# Patient Record
Sex: Female | Born: 1960 | Race: White | Hispanic: No | Marital: Married | Smoking: Never smoker
Health system: Southern US, Community
[De-identification: ages and names within clinical notes are randomized; demographics above are authoritative.]

## PROBLEM LIST (undated history)

## (undated) DIAGNOSIS — I10 Essential (primary) hypertension: Secondary | ICD-10-CM

## (undated) HISTORY — DX: Essential (primary) hypertension: I10

---

## 2004-11-28 ENCOUNTER — Ambulatory Visit: Payer: Self-pay

## 2005-04-28 HISTORY — PX: VAGINAL HYSTERECTOMY: SUR661

## 2005-07-13 ENCOUNTER — Emergency Department: Payer: Self-pay | Admitting: Emergency Medicine

## 2005-08-05 ENCOUNTER — Ambulatory Visit: Payer: Self-pay | Admitting: Gastroenterology

## 2005-11-20 ENCOUNTER — Ambulatory Visit: Payer: Self-pay | Admitting: Unknown Physician Specialty

## 2006-03-24 ENCOUNTER — Ambulatory Visit: Payer: Self-pay | Admitting: Unknown Physician Specialty

## 2007-05-11 ENCOUNTER — Ambulatory Visit: Payer: Self-pay | Admitting: Unknown Physician Specialty

## 2009-03-06 ENCOUNTER — Ambulatory Visit: Payer: Self-pay | Admitting: Unknown Physician Specialty

## 2009-05-24 ENCOUNTER — Ambulatory Visit: Payer: Self-pay | Admitting: Family Medicine

## 2009-06-15 ENCOUNTER — Ambulatory Visit: Payer: Self-pay | Admitting: Unknown Physician Specialty

## 2009-07-06 ENCOUNTER — Ambulatory Visit: Payer: Self-pay | Admitting: Gastroenterology

## 2009-08-08 ENCOUNTER — Ambulatory Visit: Payer: Self-pay | Admitting: Unknown Physician Specialty

## 2010-03-13 ENCOUNTER — Ambulatory Visit: Payer: Self-pay | Admitting: Family Medicine

## 2010-04-28 HISTORY — PX: CHOLECYSTECTOMY: SHX55

## 2010-04-28 HISTORY — PX: RIGHT OOPHORECTOMY: SHX2359

## 2010-08-25 IMAGING — NM NUCLEAR MEDICINE HEPATOHBILIARY INCLUDE GB
2 series · 12 of 12 positions shown · non-contrast
Comparison: none

REASON FOR EXAM: abdominal pain epigastric and RUQ and nausea If Neg US
do Hida with CCK
COMMENTS:

[Series 1000: gallbladder dynamic (results) · 4.80mm/px · 6 of 60 frames shown]
[frame 6/60]
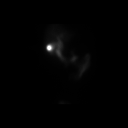
[frame 16/60]
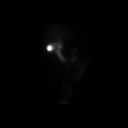
[frame 26/60]
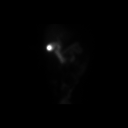
[frame 36/60]
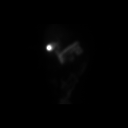
[frame 46/60]
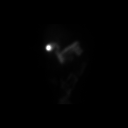
[frame 56/60]
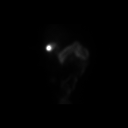

[Series 1000: gallbladder dynamic · 4.80mm/px · 6 of 60 frames shown]
[frame 6/60]
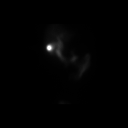
[frame 16/60]
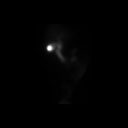
[frame 26/60]
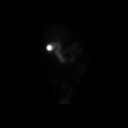
[frame 36/60]
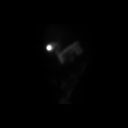
[frame 46/60]
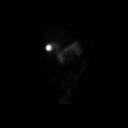
[frame 56/60]
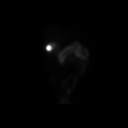

[12 of 12 positions shown; findings below may reference images not displayed]

PROCEDURE:     NM  - NM HEPATO WITH GB EJECT FRACTION  - July 06, 2009 [DATE]

RESULT:     Following intravenous administration of 7.98 mCi technetium 99m
Choletec, there is noted prompt visualization of tracer activity in the
liver at 3 minutes. At 30 minutes tracer activity is visualized in the
gallbladder, common duct and proximal small bowel.

The gallbladder ejection fraction at 30 minutes measures 6% which is below
the normal range.
IMPRESSION: 1. Normal hepatobiliary scan.
2. The gallbladder ejection fraction measures 6% which is below the normal
range.

## 2011-01-01 ENCOUNTER — Encounter: Payer: Self-pay | Admitting: Internal Medicine

## 2011-01-01 ENCOUNTER — Ambulatory Visit (INDEPENDENT_AMBULATORY_CARE_PROVIDER_SITE_OTHER): Payer: PRIVATE HEALTH INSURANCE | Admitting: Internal Medicine

## 2011-01-01 DIAGNOSIS — I10 Essential (primary) hypertension: Secondary | ICD-10-CM | POA: Insufficient documentation

## 2011-01-01 DIAGNOSIS — R002 Palpitations: Secondary | ICD-10-CM | POA: Insufficient documentation

## 2011-01-01 MED ORDER — CARVEDILOL 3.125 MG PO TABS: 3.1250 mg | ORAL_TABLET | Freq: Two times a day (BID) | ORAL | Status: DC

## 2011-01-01 NOTE — Progress Notes (Signed)
Subjective:    Patient ID: Elizabeth Dunn, female    DOB: Dec 29, 1960, 50 y.o.   MRN: [id number]  HPI Elizabeth Dunn is a 50 year old female who presents to establish care. She reports recent problems with palpitations and high blood pressure. She was seen by her previous primary care physician and noted to have heart rate of 120 and blood pressure 190s/90s. She did not have EKG at this time. He ultimately placed her on metoprolol and hydrochlorothiazide. She took the metoprolol but developed some fatigue and sweats, so opted to stop this medication. She took the hydrochlorothiazide for one week total and then stopped. She continues to have episodes where she feels her heart racing. These episodes occur several times per day and are not associated with exertion. During these episodes she does not have chest pain, diaphoresis, nausea or other symptoms. She denies any other complaints at this time. She does note recent weight loss of nearly 20 pounds, but attributes this to her new job where she walks approximately 12 miles daily. She reports good appetite and very healthy diet low in saturated fat and high in fiber. She denies any fatigue, chills, or other complaints.  Outpatient Encounter Prescriptions as of 01/01/2011  Medication Sig Dispense Refill  . ALPRAZolam (XANAX) 0.5 MG tablet Take 0.5 mg by mouth 2 (two) times daily.        Marland Kitchen FLUoxetine (PROZAC) 20 MG capsule Take 20 mg by mouth daily.          Review of Systems  Constitutional: Negative for fever, chills, appetite change, fatigue and unexpected weight change.  HENT: Positive for rhinorrhea and postnasal drip. Negative for ear pain, congestion, sore throat, trouble swallowing, neck pain, voice change and sinus pressure.   Eyes: Negative for visual disturbance.  Respiratory: Positive for chest tightness. Negative for cough, shortness of breath, wheezing and stridor.   Cardiovascular: Positive for palpitations. Negative for chest pain and leg  swelling.  Gastrointestinal: Negative for nausea, vomiting, abdominal pain, diarrhea, constipation, blood in stool, abdominal distention and anal bleeding.  Genitourinary: Negative for dysuria and flank pain.  Musculoskeletal: Negative for myalgias, arthralgias and gait problem.  Skin: Negative for color change, pallor and rash.  Neurological: Negative for dizziness and headaches.  Hematological: Negative for adenopathy. Does not bruise/bleed easily.  Psychiatric/Behavioral: Negative for suicidal ideas, sleep disturbance and dysphoric mood. The patient is not nervous/anxious.     BP 168/93  Pulse 103  Temp 98.2 F (36.8 C)  Resp 16  Ht 5\' 7"  (1.702 m)  Wt 172 lb 8 oz (78.245 kg)  BMI 27.02 kg/m2  SpO2 98%     Objective:   Physical Exam  Constitutional: She is oriented to person, place, and time. She appears well-developed and well-nourished. No distress.  HENT:  Head: Normocephalic and atraumatic.  Right Ear: Hearing, tympanic membrane, external ear and ear canal normal.  Left Ear: Hearing, tympanic membrane, external ear and ear canal normal.  Nose: Nose normal.  Mouth/Throat: Oropharynx is clear and moist and mucous membranes are normal. No oropharyngeal exudate.  Eyes: Conjunctivae are normal. Pupils are equal, round, and reactive to light. Right eye exhibits no discharge. Left eye exhibits no discharge. No scleral icterus.  Neck: Trachea normal and normal range of motion. Neck supple. Normal carotid pulses and no JVD present. Carotid bruit is not present. No tracheal deviation present. No thyromegaly present.  Cardiovascular: Regular rhythm, S1 normal, S2 normal, normal heart sounds, intact distal pulses and normal pulses.  Tachycardia present.  Exam reveals no gallop and no friction rub.   No murmur heard.      BP repeated in both arms 162/90 right arm, 160/88 left arm  Pulmonary/Chest: Effort normal and breath sounds normal. No respiratory distress. She has no wheezes. She  has no rales. She exhibits no tenderness.  Musculoskeletal: Normal range of motion. She exhibits no edema and no tenderness.       Arms:      Left arm slightly larger than right  Lymphadenopathy:    She has no cervical adenopathy.  Neurological: She is alert and oriented to person, place, and time. No cranial nerve deficit. She exhibits normal muscle tone. Coordination normal.  Skin: Skin is warm and dry. No rash noted. She is not diaphoretic. No erythema. No pallor.  Psychiatric: She has a normal mood and affect. Her behavior is normal. Judgment and thought content normal.    EKG: NSR      Assessment & Plan:  1. Palpitations - patient with a recent history of palpitations and tachycardia. Noted to be tachycardic on initial exam today. Otherwise cardiac exam is normal. EKG showed normal sinus rhythm with a rate of 90. Question if she may have thyroid dysfunction contributing to her tachycardia. Will check a CBC, TSH and CMP with labs today. She will return to clinic in one week. If the labs unremarkable, would prefer to set her up with a Holter monitor for further evaluation.  2. Hypertension - patient noted to have elevated blood pressure both today and by her previous physician. She is not currently taking any medications for this. Will start carvedilol 3.25 mg by mouth twice daily. She was not able to tolerate metoprolol, but hoping she will tolerate a more cardioselective beta blocker. She will return to clinic in one week.

## 2011-01-01 NOTE — Patient Instructions (Signed)
Labs today. We will call with results. Return visit in 1 week.

## 2011-01-02 LAB — CBC WITH DIFFERENTIAL/PLATELET
Basophils Absolute: 0 10*3/uL (ref 0.0–0.1)
Eosinophils Absolute: 0.1 10*3/uL (ref 0.0–0.7)
HCT: 40.9 % (ref 36.0–46.0)
Hemoglobin: 13.7 g/dL (ref 12.0–15.0)
Lymphs Abs: 1.4 10*3/uL (ref 0.7–4.0)
MCHC: 33.6 g/dL (ref 30.0–36.0)
MCV: 95.9 fl (ref 78.0–100.0)
Monocytes Absolute: 0.3 10*3/uL (ref 0.1–1.0)
Monocytes Relative: 5.5 % (ref 3.0–12.0)
Neutro Abs: 4.1 10*3/uL (ref 1.4–7.7)
Platelets: 329 10*3/uL (ref 150.0–400.0)
RDW: 12.3 % (ref 11.5–14.6)

## 2011-01-02 LAB — COMPREHENSIVE METABOLIC PANEL
AST: 25 U/L (ref 0–37)
BUN: 16 mg/dL (ref 6–23)
Calcium: 8.9 mg/dL (ref 8.4–10.5)
Chloride: 102 mEq/L (ref 96–112)
Creatinine, Ser: 0.5 mg/dL (ref 0.4–1.2)
Total Bilirubin: 0.8 mg/dL (ref 0.3–1.2)

## 2011-01-03 ENCOUNTER — Other Ambulatory Visit: Payer: Self-pay | Admitting: Internal Medicine

## 2011-01-03 DIAGNOSIS — R000 Tachycardia, unspecified: Secondary | ICD-10-CM

## 2011-01-06 ENCOUNTER — Encounter: Payer: Self-pay | Admitting: Internal Medicine

## 2011-01-08 ENCOUNTER — Encounter: Payer: Self-pay | Admitting: Internal Medicine

## 2011-01-08 ENCOUNTER — Encounter (INDEPENDENT_AMBULATORY_CARE_PROVIDER_SITE_OTHER): Payer: PRIVATE HEALTH INSURANCE | Admitting: *Deleted

## 2011-01-08 ENCOUNTER — Telehealth: Payer: Self-pay | Admitting: Internal Medicine

## 2011-01-08 ENCOUNTER — Ambulatory Visit (INDEPENDENT_AMBULATORY_CARE_PROVIDER_SITE_OTHER): Payer: PRIVATE HEALTH INSURANCE | Admitting: Internal Medicine

## 2011-01-08 VITALS — BP 160/89 | HR 83 | Temp 98.3°F | Resp 16 | Ht 67.0 in | Wt 173.5 lb

## 2011-01-08 DIAGNOSIS — I471 Supraventricular tachycardia: Secondary | ICD-10-CM

## 2011-01-08 DIAGNOSIS — F411 Generalized anxiety disorder: Secondary | ICD-10-CM

## 2011-01-08 DIAGNOSIS — R002 Palpitations: Secondary | ICD-10-CM

## 2011-01-08 DIAGNOSIS — F419 Anxiety disorder, unspecified: Secondary | ICD-10-CM | POA: Insufficient documentation

## 2011-01-08 DIAGNOSIS — I10 Essential (primary) hypertension: Secondary | ICD-10-CM

## 2011-01-08 MED ORDER — FLUOXETINE HCL 20 MG PO CAPS: 40.0000 mg | ORAL_CAPSULE | Freq: Every day | ORAL | Status: DC

## 2011-01-08 MED ORDER — CARVEDILOL 3.125 MG PO TABS: 6.2500 mg | ORAL_TABLET | Freq: Two times a day (BID) | ORAL | Status: DC

## 2011-01-08 NOTE — Telephone Encounter (Signed)
Patient was informed of her results today at her visit.

## 2011-01-08 NOTE — Patient Instructions (Signed)
Holter monitor today. Follow up with Dr. Mariah Milling next week at [facility].

## 2011-01-08 NOTE — Progress Notes (Signed)
Subjective:    Patient ID: Elizabeth Dunn, female    DOB: May 01, 1960, 50 y.o.   MRN: [id number]  HPI Elizabeth Dunn is a 50 year old female with a recent history of hypertension and palpitations. She reports the palpitations occur frequently during the day and are described as a rapid regular heart beats accompanied by tightness in her mid chest. She denies any shortness of breath, diaphoresis, nausea or other symptoms. At her last visit we checked lab work including CBC, electrolytes, and thyroid function all of which were normal. We also checked EKG which was normal. We started her on carvedilol, however, she reports no improvement on this medication.  Outpatient Prescriptions Prior to Visit  Medication Sig Dispense Refill  . ALPRAZolam (XANAX) 0.5 MG tablet Take 0.5 mg by mouth 2 (two) times daily.        . carvedilol (COREG) 3.125 MG tablet Take 1 tablet (3.125 mg total) by mouth 2 (two) times daily.  60 tablet  11  . FLUoxetine (PROZAC) 20 MG capsule Take 20 mg by mouth daily.          Review of Systems  Constitutional: Negative for fever, chills and fatigue.  Respiratory: Positive for chest tightness. Negative for cough, shortness of breath and wheezing.   Cardiovascular: Positive for chest pain and palpitations. Negative for leg swelling.  Neurological: Negative for dizziness, weakness and light-headedness.  Psychiatric/Behavioral: Negative for dysphoric mood. The patient is not nervous/anxious.    BP 160/89  Pulse 83  Temp(Src) 98.3 F (36.8 C) (Oral)  Resp 16  Ht 5\' 7"  (1.702 m)  Wt 173 lb 8 oz (78.699 kg)  BMI 27.17 kg/m2  SpO2 100%     Objective:   Physical Exam  Constitutional: She is oriented to person, place, and time. She appears well-developed and well-nourished. No distress.  HENT:  Head: Normocephalic and atraumatic.  Right Ear: External ear normal.  Left Ear: External ear normal.  Nose: Nose normal.  Mouth/Throat: Oropharynx is clear and moist. No oropharyngeal  exudate.  Eyes: Conjunctivae and EOM are normal. Pupils are equal, round, and reactive to light. Right eye exhibits no discharge. Left eye exhibits no discharge. No scleral icterus.  Neck: Normal range of motion. Neck supple. Carotid bruit is not present. No tracheal deviation present. No thyromegaly present.  Cardiovascular: Normal rate, regular rhythm, normal heart sounds and intact distal pulses.  Exam reveals no gallop and no friction rub.   No murmur heard. Pulmonary/Chest: Effort normal and breath sounds normal. No respiratory distress. She has no wheezes. She has no rales. She exhibits no tenderness.  Musculoskeletal: Normal range of motion. She exhibits no edema and no tenderness.  Lymphadenopathy:    She has no cervical adenopathy.  Neurological: She is alert and oriented to person, place, and time. No cranial nerve deficit. She exhibits normal muscle tone. Coordination normal.  Skin: Skin is warm and dry. No rash noted. She is not diaphoretic. No erythema. No pallor.  Psychiatric: She has a normal mood and affect. Her behavior is normal. Judgment and thought content normal.          Assessment & Plan:  1. Palpitations - patient with recent history of palpitations. Evaluation here including lab work (CBC, CMP, TSH) and EKG were unremarkable. Exam today is normal. Question if she may be having runs of SVT. I discussed her case with Dr. Mariah Milling from cardiology today. We will set her up with a Holter monitor and she will have an evaluation with Dr. Mariah Milling  next week. Today, we will increase her dose of carvedilol to 6.25 mg twice daily. She will return to clinic here in one month, or earlier should her symptoms become more severe.  2. Hypertension - as above, will increase dose of carvedilol today. Recent lab evaluation including renal function was normal. She will return to clinic in one month.  3. Anxiety - patient has a history of anxiety in the past. She is currently taking Prozac. She  reports no recent increase in her anxiety or stress level. However, she recognizes that anxiety may contribute to palpitations. We will try increasing her Prozac to 40 mg daily to see if this results in any improvement in her palpitations.

## 2011-01-15 ENCOUNTER — Encounter: Payer: Self-pay | Admitting: Internal Medicine

## 2011-01-15 ENCOUNTER — Ambulatory Visit (INDEPENDENT_AMBULATORY_CARE_PROVIDER_SITE_OTHER): Payer: PRIVATE HEALTH INSURANCE | Admitting: Internal Medicine

## 2011-01-15 VITALS — BP 149/88 | HR 79 | Temp 98.9°F | Resp 16 | Ht 67.0 in | Wt 175.5 lb

## 2011-01-15 DIAGNOSIS — N760 Acute vaginitis: Secondary | ICD-10-CM

## 2011-01-15 MED ORDER — ESTRADIOL 0.1 MG/GM VA CREA: 1.0000 g | TOPICAL_CREAM | Freq: Every day | VAGINAL | Status: DC

## 2011-01-15 MED ORDER — NYSTATIN-TRIAMCINOLONE 100000-0.1 UNIT/GM-% EX OINT: TOPICAL_OINTMENT | Freq: Two times a day (BID) | CUTANEOUS | Status: DC

## 2011-01-15 NOTE — Patient Instructions (Signed)
Apply the Estrace cream every night for 2 weeks, then 1-2 times per week. Apply the triamcinolone cream daily x 3 days. Return to clinic in 1 month.

## 2011-01-15 NOTE — Progress Notes (Signed)
  Subjective:    Patient ID: Elizabeth Dunn, female    DOB: [birth year], 50 y.o.   MRN: [id number]  HPI Elizabeth Dunn is a 50 year old female who presents for an acute visit complaining of vaginal pain. She reports recurrent episodes of vaginal pain, which typically occur on a monthly basis. These are located on the left side of her vagina. She has been evaluated in the past by gynecology and was treated with a topical cream for unknown condition. She denies any history of herpes. She does note that during her first marriage her husband was unfaithful to her and she may have been exposed to sexual transmitted diseases. She denies any vaginal discharge. She denies any abdominal pain, fever, chills, dysuria, or other symptoms. The skin on the left side of her genital area is typically tender to touch for several days and then the tenderness resolves. She has tried topical over-the-counter cream such as Desitin or topical powders with no improvement. She's not currently sexually active on a regular basis with her husband. She reports her last intercourse was approximately 6 months ago.  Outpatient Encounter Prescriptions as of 01/15/2011  Medication Sig Dispense Refill  . ALPRAZolam (XANAX) 0.5 MG tablet Take 0.5 mg by mouth 2 (two) times daily.        . carvedilol (COREG) 3.125 MG tablet Take 2 tablets (6.25 mg total) by mouth 2 (two) times daily.  60 tablet  11  . FLUoxetine (PROZAC) 20 MG capsule Take 2 capsules (40 mg total) by mouth daily.  30 capsule  11    Review of Systems  Constitutional: Negative for fever and chills.  Gastrointestinal: Negative for abdominal pain.  Genitourinary: Positive for vaginal pain. Negative for dysuria, flank pain, vaginal bleeding, vaginal discharge and pelvic pain.   BP 149/88  Pulse 79  Temp(Src) 98.9 F (37.2 C) (Oral)  Resp 16  Ht 5\' 7"  (1.702 m)  Wt 175 lb 8 oz (79.606 kg)  BMI 27.49 kg/m2  SpO2 100%     Objective:   Physical Exam  Constitutional: She  appears well-developed and well-nourished.  HENT:  Head: Normocephalic and atraumatic.  Genitourinary:    There is tenderness and lesion on the left labia. No vaginal discharge found.       Erythema noted to left of labia with small abrasion          Assessment & Plan:  1. Vaginitis -patient with left-sided genital pain. Exam is remarkable for an erythematous area on the lower left side of the labia majora. There is a small abrasion at this site. There are no vesicles or other evidence of herpes infection. However, given that this pain occurs on an intermittent basis in the same location it is suspicious for herpes infection. We will send serology for HSV 1 and 2. If positive, will consider treating with acyclovir. Given her age there may be an element of atrophic vaginitis, leading to the abrasion. We'll try applying topical Estrace cream daily for 2 weeks then one to 2 times per week. She will also apply topical nystatin and triamcinolone cream for the next 2 or 3 days to see if any improvement. She will call to report what other creams she has been using. She will followup in one month.

## 2011-01-16 ENCOUNTER — Encounter: Payer: Self-pay | Admitting: Cardiovascular Disease

## 2011-01-16 ENCOUNTER — Telehealth: Payer: Self-pay | Admitting: Internal Medicine

## 2011-01-16 ENCOUNTER — Ambulatory Visit (INDEPENDENT_AMBULATORY_CARE_PROVIDER_SITE_OTHER): Payer: PRIVATE HEALTH INSURANCE | Admitting: Cardiovascular Disease

## 2011-01-16 VITALS — BP 159/84 | HR 74 | Resp 18 | Ht 67.0 in | Wt 173.0 lb

## 2011-01-16 DIAGNOSIS — I10 Essential (primary) hypertension: Secondary | ICD-10-CM

## 2011-01-16 DIAGNOSIS — F419 Anxiety disorder, unspecified: Secondary | ICD-10-CM

## 2011-01-16 DIAGNOSIS — R002 Palpitations: Secondary | ICD-10-CM

## 2011-01-16 LAB — HSV 2 ANTIBODY, IGG: HSV 2 Glycoprotein G Ab, IgG: 0.12 IV

## 2011-01-16 MED ORDER — LISINOPRIL 20 MG PO TABS: 20.0000 mg | ORAL_TABLET | Freq: Every day | ORAL | Status: DC

## 2011-01-16 MED ORDER — CARVEDILOL 25 MG PO TABS: 25.0000 mg | ORAL_TABLET | Freq: Two times a day (BID) | ORAL | Status: DC

## 2011-01-16 NOTE — Patient Instructions (Addendum)
You are doing well. Please increase the coreg to 12.5 mg twice a day (cut the coreg 25 mg in 1/2) If you continue to have fast heart rate after 5 days, you could try a full pill (25 mg) Please start lisinopril 20 mg daily for high blod pressure Please call us if you have new issues that need to be addressed before your next appt.  We will call you for a follow up Appt. In 1 months

## 2011-01-16 NOTE — Telephone Encounter (Signed)
There is not a cheaper alternative that I know of. Premarin is equally as expensive.  We can try to get her more samples.

## 2011-01-16 NOTE — Telephone Encounter (Signed)
Patient called and stated that the Estrace Cream is too expensive at $139 a tube.  She inquired about getting a substitute that would work but cheaper. Please advise.

## 2011-01-16 NOTE — Assessment & Plan Note (Signed)
Her underlying anxiety may be exacerbating her tachycardia appeared she recently had an increase to her SSRI.

## 2011-01-16 NOTE — Assessment & Plan Note (Signed)
We will start her on lisinopril 10 mg, titrated to 20 mg if needed for hypertension. Most of her record her blood pressures at home when she is relaxed are elevated with systolic pressure greater than 150. An alternate medication that could be tried would be diltiazem cd 120 mg. This could potentially even help her heart rate and any arrhythmia.

## 2011-01-16 NOTE — Progress Notes (Signed)
Patient ID: Elizabeth Dunn, female    DOB: 03/13/1961, 50 y.o.   MRN: [id number]  HPI Comments: Elizabeth Dunn is a very pleasant 50 year old woman, patient of Dr. Dan Humphreys, who presents by referral for symptoms of palpitations.  She recently completed a 48-hour Holter monitor that showed normal sinus rhythm with periods of sinus tachycardia, rare APC, very rare short run of SVT lasting 6 beats. She was taking coronary 6.25 mg b.i.d. While wearing the Holter. Her heart rate was elevated, typically in the 100-120 beats per minute range while at work, improving into the 90s while at rest at home, into the 85-90 range while sleeping. No other significant arrhythmias were noted.  She reports that her blood pressure has been running high for quite some time now with systolic pressures typically in the 150-170 range. She works long hours, denies any significant stress. She reports a family history of hypertension. She appreciates her fast heart rhythm more than normal over the past several weeks.   She did have an episode 4 weeks here where she felt very shaky, blood pressure was very high at 190/90, heart rate of 120. She was started on metoprolol succinate though she reports this made her feel ill with sweating. She has since changed to Coreg 3.125 mg b.i.d. With increase to 6.25mg  B.i.d. While she wore the Holter.  EKG today shows normal sinus rhythm with rate 80 beats per minute with no significant ST or T wave changes   Outpatient Encounter Prescriptions as of 50/20/2012  Medication Sig Dispense Refill  . ALPRAZolam (XANAX) 0.5 MG tablet Take 0.5 mg by mouth 2 (two) times daily.        Marland Kitchen estradiol (ESTRACE) 0.1 MG/GM vaginal cream Place 1 g vaginally daily.  42.5 g  1  . FLUoxetine (PROZAC) 20 MG capsule Take 2 capsules (40 mg total) by mouth daily.  30 capsule  11  . carvedilol (COREG) 3.125 MG tablet Take 2 tablets (6.25 mg total) by mouth 2 (two) times daily.  60 tablet  11  . DISCONTD:  nystatin-triamcinolone (MYCOLOG) ointment Apply topically 2 (two) times daily.  30 g  0     Review of Systems  Constitutional: Negative.   HENT: Negative.   Eyes: Negative.   Respiratory: Negative.   Cardiovascular: Positive for palpitations.  Gastrointestinal: Negative.   Musculoskeletal: Negative.   Skin: Negative.   Neurological: Negative.   Hematological: Negative.   Psychiatric/Behavioral: Negative.   All other systems reviewed and are negative.    BP 159/84  Pulse 74  Resp 18  Ht 5\' 7"  (1.702 m)  Wt 173 lb (78.472 kg)  BMI 27.10 kg/m2  Physical Exam  Nursing note and vitals reviewed. Constitutional: She is oriented to person, place, and time. She appears well-developed and well-nourished.  HENT:  Head: Normocephalic.  Nose: Nose normal.  Mouth/Throat: Oropharynx is clear and moist.  Eyes: Conjunctivae are normal. Pupils are equal, round, and reactive to light.  Neck: Normal range of motion. Neck supple. No JVD present.  Cardiovascular: Normal rate, regular rhythm, S1 normal, S2 normal, normal heart sounds and intact distal pulses.  Exam reveals no gallop and no friction rub.   No murmur heard. Pulmonary/Chest: Effort normal and breath sounds normal. No respiratory distress. She has no wheezes. She has no rales. She exhibits no tenderness.  Abdominal: Soft. Bowel sounds are normal. She exhibits no distension. There is no tenderness.  Musculoskeletal: Normal range of motion. She exhibits no edema and no tenderness.  Lymphadenopathy:    She has no cervical adenopathy.  Neurological: She is alert and oriented to person, place, and time. Coordination normal.  Skin: Skin is warm and dry. No rash noted. No erythema.  Psychiatric: She has a normal mood and affect. Her behavior is normal. Judgment and thought content normal.         Assessment and Plan

## 2011-01-16 NOTE — Assessment & Plan Note (Addendum)
She does have an elevated heart rate (sinus tach) seen while at work with elevated baseline heart rate. She did not tolerate metoprolol succinate though appears to be tolerating coreg. We have suggested she increase her coreg slowly as tolerated to 12.6 mg BID and possibly to 25 mg BID if needed for continued tachycardia.  If she is unable to tolerate the higher dose coreg, we would suggest bystolic.  She did have a very short run of SVT. I suspect her palpitations is from sinus tachycardia and she was asymptomatic from the SVT given it was 6 beats.

## 2011-01-20 ENCOUNTER — Other Ambulatory Visit: Payer: Self-pay | Admitting: *Deleted

## 2011-01-20 MED ORDER — ALPRAZOLAM 0.5 MG PO TABS: 0.5000 mg | ORAL_TABLET | Freq: Two times a day (BID) | ORAL | Status: DC

## 2011-01-20 NOTE — Telephone Encounter (Signed)
Patient notified. She says that she spoke with the pharmacy and they told her that if the two ingredients that are in estrace are prescribed separately instead of combined it would be cheaper for her. She is going to have the pharmacy fax over the ingredients.

## 2011-01-20 NOTE — Telephone Encounter (Signed)
Lets just have them fill them separately.

## 2011-01-20 NOTE — Telephone Encounter (Signed)
Pharmacy notified.

## 2011-01-20 NOTE — Telephone Encounter (Signed)
Pharmacy called, they said that patient was mistaken about the estrace cream, pharmacy says that there really isn't anything cheaper and you can't separate ingredients. The pharmacy was actually talking about he nystatin-triamcinolone. They are asking if they could fill only for nystatin or only triamcinolone because the two together is really expensive.

## 2011-01-20 NOTE — Telephone Encounter (Signed)
Opened in error

## 2011-01-21 ENCOUNTER — Other Ambulatory Visit: Payer: Self-pay | Admitting: Internal Medicine

## 2011-01-21 MED ORDER — ALPRAZOLAM 0.5 MG PO TABS: 0.5000 mg | ORAL_TABLET | Freq: Two times a day (BID) | ORAL | Status: DC

## 2011-01-23 ENCOUNTER — Telehealth: Payer: Self-pay | Admitting: *Deleted

## 2011-01-23 NOTE — Telephone Encounter (Signed)
Attempted to contact pt to give holter monitor results, LMOM TCB. Per Dr. Mariah Milling- NSR with rare SVT (6 beats), rare PVCs. Elevated HR during the day. Pt has f/u with Dr. Dan Humphreys 02/12/11. Will discuss with pt upon return call.

## 2011-01-27 NOTE — Telephone Encounter (Signed)
Results reviewed at [facility] 9/20, pt has no c/o or questions at this time. Will f/u as scheduled.

## 2011-02-12 ENCOUNTER — Ambulatory Visit: Payer: PRIVATE HEALTH INSURANCE | Admitting: Internal Medicine

## 2011-02-13 ENCOUNTER — Encounter: Payer: Self-pay | Admitting: Internal Medicine

## 2011-02-13 ENCOUNTER — Ambulatory Visit (INDEPENDENT_AMBULATORY_CARE_PROVIDER_SITE_OTHER): Payer: PRIVATE HEALTH INSURANCE | Admitting: Internal Medicine

## 2011-02-13 VITALS — BP 130/83 | HR 79 | Temp 98.3°F | Resp 12 | Ht 67.0 in | Wt 176.0 lb

## 2011-02-13 DIAGNOSIS — N760 Acute vaginitis: Secondary | ICD-10-CM

## 2011-02-13 DIAGNOSIS — F411 Generalized anxiety disorder: Secondary | ICD-10-CM

## 2011-02-13 DIAGNOSIS — F419 Anxiety disorder, unspecified: Secondary | ICD-10-CM

## 2011-02-13 DIAGNOSIS — I10 Essential (primary) hypertension: Secondary | ICD-10-CM

## 2011-02-13 MED ORDER — NYSTATIN 100000 UNIT/GM EX POWD: 1.0000 g | Freq: Two times a day (BID) | CUTANEOUS | Status: DC

## 2011-02-13 MED ORDER — CLONAZEPAM 1 MG PO TABS: 1.0000 mg | ORAL_TABLET | Freq: Three times a day (TID) | ORAL | Status: DC

## 2011-02-13 MED ORDER — TRIAMCINOLONE ACETONIDE 0.025 % EX OINT: TOPICAL_OINTMENT | Freq: Two times a day (BID) | CUTANEOUS | Status: AC

## 2011-02-13 NOTE — Progress Notes (Signed)
Subjective:    Patient ID: Elizabeth Dunn, female    DOB: 11-09-1960, 50 y.o.   MRN: [id number]  HPI Elizabeth Dunn is a 50 year old female with a history of anxiety, hypertension, and a recent episode of vaginitis who presents for followup. She reports that since using the nystatin cream she has had minimal improvement in her symptoms of external vaginal pain. She denies any vaginal discharge or itching. She denies any fever or chills. She has been applying both the nystatin cream and using Estrace.  She notes a recent increase in her anxiety. She reports that she is easily overwhelmed by every day activities such as preparing dinner for her family. In the past, she has taken Xanax with improvement in symptoms, however she feels that this is no longer working well for her. She notes that in the past she has suffered from depression and this seems well controlled on Prozac. However, her anxiety and sense of agitation have gotten progressively worse recently. She denies any recent family or work stressors.  In regards to her hypertension and palpitations she reports no further symptoms since she has been taking carvedilol 25 mg daily.  Outpatient Encounter Prescriptions as of 02/13/2011  Medication Sig Dispense Refill  . carvedilol (COREG) 25 MG tablet Take 1 tablet (25 mg total) by mouth 2 (two) times daily.  60 tablet  11  . estradiol (ESTRACE) 0.1 MG/GM vaginal cream Place 1 g vaginally daily.  42.5 g  1  . FLUoxetine (PROZAC) 20 MG capsule Take 2 capsules (40 mg total) by mouth daily.  30 capsule  11  . DISCONTD: ALPRAZolam (XANAX) 0.5 MG tablet Take 1 tablet (0.5 mg total) by mouth 2 (two) times daily.  60 tablet  0  . lisinopril (PRINIVIL,ZESTRIL) 20 MG tablet Take 1 tablet (20 mg total) by mouth daily.  30 tablet  11    Review of Systems  Constitutional: Negative for fever, chills, appetite change, fatigue and unexpected weight change.  HENT: Negative for ear pain, congestion, sore throat,  trouble swallowing, neck pain, voice change and sinus pressure.   Eyes: Negative for visual disturbance.  Respiratory: Negative for cough, shortness of breath, wheezing and stridor.   Cardiovascular: Negative for chest pain, palpitations and leg swelling.  Gastrointestinal: Negative for nausea, vomiting, abdominal pain, diarrhea, constipation, blood in stool, abdominal distention and anal bleeding.  Genitourinary: Positive for vaginal pain. Negative for dysuria and flank pain.  Musculoskeletal: Negative for myalgias, arthralgias and gait problem.  Skin: Negative for color change and rash.  Neurological: Negative for dizziness and headaches.  Hematological: Negative for adenopathy. Does not bruise/bleed easily.  Psychiatric/Behavioral: Negative for suicidal ideas, sleep disturbance and dysphoric mood. The patient is nervous/anxious.    BP 130/83  Pulse 79  Temp 98.3 F (36.8 C)  Resp 12  Ht 5\' 7"  (1.702 m)  Wt 176 lb (79.833 kg)  BMI 27.57 kg/m2  SpO2 100%     Objective:   Physical Exam  Constitutional: She is oriented to person, place, and time. She appears well-developed and well-nourished. No distress.  HENT:  Head: Normocephalic and atraumatic.  Right Ear: External ear normal.  Left Ear: External ear normal.  Nose: Nose normal.  Mouth/Throat: Oropharynx is clear and moist. No oropharyngeal exudate.  Eyes: Conjunctivae are normal. Pupils are equal, round, and reactive to light. Right eye exhibits no discharge. Left eye exhibits no discharge. No scleral icterus.  Neck: Normal range of motion. Neck supple. No tracheal deviation present. No thyromegaly present.  Cardiovascular: Normal rate, regular rhythm, normal heart sounds and intact distal pulses.  Exam reveals no gallop and no friction rub.   No murmur heard. Pulmonary/Chest: Effort normal and breath sounds normal. No respiratory distress. She has no wheezes. She has no rales. She exhibits no tenderness.  Genitourinary:      Musculoskeletal: Normal range of motion. She exhibits no edema and no tenderness.  Lymphadenopathy:    She has no cervical adenopathy.  Neurological: She is alert and oriented to person, place, and time. No cranial nerve deficit. She exhibits normal muscle tone. Coordination normal.  Skin: Skin is warm and dry. No rash noted. She is not diaphoretic. No erythema. No pallor.  Psychiatric: Her behavior is normal. Judgment and thought content normal. Her mood appears anxious.          Assessment & Plan:  1. Anxiety - symptoms recently worsen. Suspect that she would benefit from a longer acting anxiolytic medication. Will stop Xanax and start Clonopin. We'll start at 0.5 mg up to 3 times per day. She will advance to up to 1 mg 3 times daily as needed. She will followup in one month.  2. Vaginitis -patient with longstanding vaginitis. Exam is improved today with no further lacerations. However, I think she would benefit from nystatin powder as this would be more drying. Prescription written for nystatin powder today. We'll also use triamcinolone cream as needed for areas of external pruritus. She will followup in one month.  3. Hypertension - patient with hypertension and palpitations which are now well-controlled on carvedilol and lisinopril. We'll continue current medications. She will followup in one month.

## 2011-03-18 ENCOUNTER — Ambulatory Visit: Payer: PRIVATE HEALTH INSURANCE | Admitting: Cardiovascular Disease

## 2011-03-27 ENCOUNTER — Ambulatory Visit (INDEPENDENT_AMBULATORY_CARE_PROVIDER_SITE_OTHER): Payer: PRIVATE HEALTH INSURANCE | Admitting: Internal Medicine

## 2011-03-27 ENCOUNTER — Encounter: Payer: Self-pay | Admitting: Internal Medicine

## 2011-03-27 VITALS — BP 142/86 | HR 86 | Temp 97.5°F | Wt 179.0 lb

## 2011-03-27 DIAGNOSIS — J329 Chronic sinusitis, unspecified: Secondary | ICD-10-CM

## 2011-03-27 MED ORDER — AMOXICILLIN-POT CLAVULANATE 875-125 MG PO TABS: 1.0000 | ORAL_TABLET | Freq: Two times a day (BID) | ORAL | Status: AC

## 2011-03-27 NOTE — Patient Instructions (Signed)

## 2011-03-27 NOTE — Progress Notes (Signed)
Subjective:    Patient ID: Elizabeth Dunn, female    DOB: 08-05-1960, 50 y.o.   MRN: [id number]  Sinusitis This is a new problem. The current episode started 1 to 4 weeks ago. The problem has been gradually worsening since onset. There has been no fever. The pain is moderate. Associated symptoms include chills, congestion, coughing, ear pain, sinus pressure and a sore throat. Pertinent negatives include no neck pain, shortness of breath or swollen glands. Past treatments include oral decongestants and acetaminophen. The treatment provided no relief.  Cough This is a new problem. The current episode started 1 to 4 weeks ago. The problem has been gradually worsening. The problem occurs every few minutes. The cough is productive of sputum. Associated symptoms include chills, ear pain, myalgias, postnasal drip, rhinorrhea and a sore throat. Pertinent negatives include no chest pain, fever or shortness of breath. The symptoms are aggravated by nothing. She has tried OTC cough suppressant and rest for the symptoms. The treatment provided no relief. There is no history of asthma or COPD.     Outpatient Encounter Prescriptions as of 03/27/2011  Medication Sig Dispense Refill  . carvedilol (COREG) 25 MG tablet Take 1 tablet (25 mg total) by mouth 2 (two) times daily.  60 tablet  11  . clonazePAM (KLONOPIN) 1 MG tablet Take 1 tablet (1 mg total) by mouth 3 (three) times daily.  90 tablet  3  . estradiol (ESTRACE) 0.1 MG/GM vaginal cream Place 1 g vaginally daily as needed.        Marland Kitchen FLUoxetine (PROZAC) 20 MG capsule Take 20 mg by mouth daily.        Marland Kitchen lisinopril (PRINIVIL,ZESTRIL) 20 MG tablet Take 1 tablet (20 mg total) by mouth daily.  30 tablet  11  . nystatin (NYSTOP) 100000 UNIT/GM POWD Apply 1 g (100,000 Units total) topically 2 (two) times daily.  60 g  0  . triamcinolone (KENALOG) 0.025 % ointment Apply topically 2 (two) times daily.  30 g  0    Review of Systems  Constitutional: Positive for  chills and fatigue. Negative for fever.  HENT: Positive for ear pain, congestion, sore throat, rhinorrhea, postnasal drip and sinus pressure. Negative for neck pain, neck stiffness and ear discharge.   Respiratory: Positive for cough. Negative for chest tightness and shortness of breath.   Cardiovascular: Negative for chest pain.  Musculoskeletal: Positive for myalgias. Negative for arthralgias.   BP 142/86  Pulse 86  Temp(Src) 97.5 F (36.4 C) (Oral)  Wt 179 lb (81.194 kg)  SpO2 97%     Objective:   Physical Exam  Constitutional: She is oriented to person, place, and time. She appears well-developed and well-nourished. No distress.  HENT:  Head: Normocephalic and atraumatic.  Right Ear: External ear normal. Tympanic membrane is erythematous. A middle ear effusion is present.  Left Ear: External ear normal. Tympanic membrane is erythematous. A middle ear effusion is present.  Nose: Nose normal.  Mouth/Throat: Oropharynx is clear and moist. No oropharyngeal exudate.  Eyes: Conjunctivae are normal. Pupils are equal, round, and reactive to light. Right eye exhibits no discharge. Left eye exhibits no discharge. No scleral icterus.  Neck: Normal range of motion. Neck supple. No tracheal deviation present. No thyromegaly present.  Cardiovascular: Normal rate, regular rhythm, normal heart sounds and intact distal pulses.  Exam reveals no gallop and no friction rub.   No murmur heard. Pulmonary/Chest: Effort normal. No accessory muscle usage. Not tachypneic. No respiratory distress. She has no  decreased breath sounds. She has no wheezes. She has no rhonchi. She has no rales. She exhibits no tenderness.  Musculoskeletal: Normal range of motion. She exhibits no edema and no tenderness.  Lymphadenopathy:    She has no cervical adenopathy.  Neurological: She is alert and oriented to person, place, and time. No cranial nerve deficit. She exhibits normal muscle tone. Coordination normal.  Skin:  Skin is warm and dry. No rash noted. She is not diaphoretic. No erythema. No pallor.  Psychiatric: She has a normal mood and affect. Her behavior is normal. Judgment and thought content normal.          Assessment & Plan:  1. Sinusitis - Will treat with augmentin x 10 days and use codeine as needed for cough. Continue mucinex and prn ibuprofen and tylenol. Return to clinic if symptoms not improving over next 48hr.

## 2011-04-16 ENCOUNTER — Encounter: Payer: Self-pay | Admitting: Internal Medicine

## 2011-04-16 ENCOUNTER — Telehealth: Payer: Self-pay | Admitting: Internal Medicine

## 2011-04-16 ENCOUNTER — Ambulatory Visit (INDEPENDENT_AMBULATORY_CARE_PROVIDER_SITE_OTHER): Payer: PRIVATE HEALTH INSURANCE | Admitting: Internal Medicine

## 2011-04-16 VITALS — BP 148/82 | HR 82 | Temp 97.4°F | Wt 180.0 lb

## 2011-04-16 DIAGNOSIS — H6692 Otitis media, unspecified, left ear: Secondary | ICD-10-CM

## 2011-04-16 DIAGNOSIS — H6690 Otitis media, unspecified, unspecified ear: Secondary | ICD-10-CM

## 2011-04-16 MED ORDER — SULFAMETHOXAZOLE-TRIMETHOPRIM 800-160 MG PO TABS: 1.0000 | ORAL_TABLET | Freq: Two times a day (BID) | ORAL | Status: AC

## 2011-04-16 MED ORDER — PREDNISONE (PAK) 10 MG PO TABS: ORAL_TABLET | ORAL | Status: AC

## 2011-04-16 NOTE — Telephone Encounter (Signed)
[phone] Pt called she was in office couple week ago she has taken all her  Med for double ear infection and sinus infection. And her right ear is still hurting and her legs are achy [facility]  [phone]

## 2011-04-16 NOTE — Telephone Encounter (Signed)
Spoke w/pt - she continues to have ear pain and body aches. Pt coming in now for re-eval.

## 2011-04-16 NOTE — Progress Notes (Addendum)
Subjective:    Patient ID: Elizabeth Dunn, female    DOB: [birth year], 50 y.o.   MRN: [id number]  HPI 50 year old female with a recent history of otitis media presents for acute visit complaining of recurrent ear pain. She was previously treated with Augmentin with improvement in her symptoms. However, shortly after completing her course of antibiotics she developed recurrent bilateral ear pain and general malaise. She denies fever or chills. She does note some sinus congestion and sinus pressure. She has been using over-the-counter cough and cold preparations with no improvement. She denies any hearing loss. She denies any draining from her ears.  Outpatient Encounter Prescriptions as of 04/16/2011  Medication Sig Dispense Refill  . carvedilol (COREG) 25 MG tablet Take 1 tablet (25 mg total) by mouth 2 (two) times daily.  60 tablet  11  . clonazePAM (KLONOPIN) 1 MG tablet Take 1 tablet (1 mg total) by mouth 3 (three) times daily.  90 tablet  3  . estradiol (ESTRACE) 0.1 MG/GM vaginal cream Place 1 g vaginally daily as needed.        Marland Kitchen FLUoxetine (PROZAC) 20 MG capsule Take 20 mg by mouth daily.        Marland Kitchen lisinopril (PRINIVIL,ZESTRIL) 20 MG tablet Take 1 tablet (20 mg total) by mouth daily.  30 tablet  11  . nystatin (NYSTOP) 100000 UNIT/GM POWD Apply 1 g (100,000 Units total) topically 2 (two) times daily.  60 g  0  . triamcinolone (KENALOG) 0.025 % ointment Apply topically 2 (two) times daily.  30 g  0    Review of Systems  Constitutional: Positive for fatigue. Negative for fever and chills.  HENT: Positive for ear pain, congestion and sinus pressure. Negative for hearing loss and ear discharge.   Respiratory: Negative for cough.   Cardiovascular: Negative for chest pain.  Musculoskeletal: Positive for myalgias.   BP 148/82  Pulse 82  Temp(Src) 97.4 F (36.3 C) (Oral)  Wt 180 lb (81.647 kg)  SpO2 98%     Objective:   Physical Exam  Constitutional: She is oriented to person, place,  and time. She appears well-developed and well-nourished. No distress.  HENT:  Head: Normocephalic and atraumatic.  Right Ear: External ear normal. Tympanic membrane is bulging. A middle ear effusion is present.  Left Ear: External ear normal. Tympanic membrane is erythematous and bulging. A middle ear effusion is present.  Nose: Nose normal.  Mouth/Throat: Oropharynx is clear and moist. No oropharyngeal exudate.  Eyes: Conjunctivae are normal. Pupils are equal, round, and reactive to light. Right eye exhibits no discharge. Left eye exhibits no discharge. No scleral icterus.  Neck: Normal range of motion. Neck supple. No tracheal deviation present. No thyromegaly present.  Cardiovascular: Normal rate, regular rhythm, normal heart sounds and intact distal pulses.  Exam reveals no gallop and no friction rub.   No murmur heard. Pulmonary/Chest: Effort normal and breath sounds normal. No respiratory distress. She has no wheezes. She has no rales. She exhibits no tenderness.  Musculoskeletal: Normal range of motion. She exhibits no edema and no tenderness.  Lymphadenopathy:    She has no cervical adenopathy.  Neurological: She is alert and oriented to person, place, and time. No cranial nerve deficit. She exhibits normal muscle tone. Coordination normal.  Skin: Skin is warm and dry. No rash noted. She is not diaphoretic. No erythema. No pallor.  Psychiatric: She has a normal mood and affect. Her behavior is normal. Judgment and thought content normal.  Assessment & Plan:  1. Otitis media - symptoms and exam are most consistent with otitis media. Given that this is her second course in the last month, will treat with bactrim x10 days and prednisone taper pack. Patient will followup if symptoms are not improving over the next 7 days. If no improvement, would favor referral to ENT for further evaluation.

## 2011-05-14 ENCOUNTER — Ambulatory Visit: Payer: PRIVATE HEALTH INSURANCE | Admitting: Internal Medicine

## 2011-05-22 ENCOUNTER — Telehealth: Payer: Self-pay | Admitting: Cardiovascular Disease

## 2011-05-22 NOTE — Telephone Encounter (Signed)
Attempted to contact pt, LMOM TCB. Pt's wt 1 mo ago was 180lb (per office note). She says she has gained 15 lbs, but states wt today is 185lbs. She is concerned the carvedilol (been taking since 12/2010) has caused wt gain after she read side effects. Pt has also been on Prozac that was changed to Klonopin recently and I notified pt these meds have a tendency to cause wt gain as well. She says she walks every day and watches diet. Advised she monitor HR and BP for about a week prior to [place] changing a HR med. She thinks HR usually in 80s on carvedilol, she does not check BP often. She will call back in 1 week.

## 2011-05-22 NOTE — Telephone Encounter (Signed)
Pt calling stating that she has had a weight increase since starting carvedilol. Wants to know if she can try another medication.

## 2011-05-23 NOTE — Telephone Encounter (Signed)
Would monitor as you suggest for now.

## 2011-05-27 ENCOUNTER — Telehealth: Payer: Self-pay | Admitting: *Deleted

## 2011-05-27 NOTE — Telephone Encounter (Signed)
I don't think med is causing weight gain.  Coreg is better choice for helping with palpitations and with BP, however if she would like to change, we can set up visit to discuss.

## 2011-05-27 NOTE — Telephone Encounter (Signed)
Pt left VM - She is concerned that coreg has caused weight gain, c/o 15 lb wt gain since being on med. BP this AM was 160/93. She "has a friend" who is on dyazide and she wants to know if this is an option? Would you like to see pt or defer this issue to cardiology?

## 2011-05-28 NOTE — Telephone Encounter (Signed)
Patient informed, scheduled for OV in Feb. She will have insurance coverage late Feb but will come in sooner if BP remains elevated.

## 2011-05-30 ENCOUNTER — Telehealth: Payer: Self-pay | Admitting: Cardiovascular Disease

## 2011-05-30 NOTE — Telephone Encounter (Signed)
Forwarded to [address]  

## 2011-05-30 NOTE — Telephone Encounter (Signed)
Pt calling states that her BP is running high again. 166/80 averaging 140

## 2011-06-02 NOTE — Telephone Encounter (Signed)
Pt called to say that she is not taking coreg because it has caused her to gain weight.  Pt c/o that b/p has been elevated over past few weeks.  After interviewing pt, she stated she has not been taking her lisinopril as ordered.  Advised pt that if you have diagnosed hypertension and are not taking the prescribed antihypertensives then you should expect to have high blood pressure readings.  Recommended that pt start taking her b/p meds as ordered and keep her appt with PCP on 06/25/2011 to discuss weight gain further.

## 2011-06-02 NOTE — Telephone Encounter (Signed)
FYI, PLEASE ADVISE IF ANY ADDITIONAL INFORMATION NEEDS TO BE GIVEN TO PATIENT

## 2011-06-25 ENCOUNTER — Ambulatory Visit: Payer: PRIVATE HEALTH INSURANCE | Admitting: Internal Medicine

## 2011-07-07 ENCOUNTER — Telehealth: Payer: Self-pay | Admitting: Internal Medicine

## 2011-07-07 NOTE — Telephone Encounter (Signed)
ERROR

## 2011-07-10 ENCOUNTER — Encounter: Payer: Self-pay | Admitting: Internal Medicine

## 2011-07-10 ENCOUNTER — Ambulatory Visit (INDEPENDENT_AMBULATORY_CARE_PROVIDER_SITE_OTHER): Payer: Self-pay | Admitting: Internal Medicine

## 2011-07-10 VITALS — BP 156/82 | HR 101 | Temp 98.2°F | Ht 67.0 in | Wt 183.0 lb

## 2011-07-10 DIAGNOSIS — M1990 Unspecified osteoarthritis, unspecified site: Secondary | ICD-10-CM | POA: Insufficient documentation

## 2011-07-10 DIAGNOSIS — I10 Essential (primary) hypertension: Secondary | ICD-10-CM

## 2011-07-10 DIAGNOSIS — F410 Panic disorder [episodic paroxysmal anxiety] without agoraphobia: Secondary | ICD-10-CM | POA: Insufficient documentation

## 2011-07-10 DIAGNOSIS — M129 Arthropathy, unspecified: Secondary | ICD-10-CM

## 2011-07-10 MED ORDER — ETODOLAC 500 MG PO TABS: 500.0000 mg | ORAL_TABLET | Freq: Two times a day (BID) | ORAL | Status: DC

## 2011-07-10 MED ORDER — AMITRIPTYLINE HCL 25 MG PO TABS
25.0000 mg | ORAL_TABLET | Freq: Every day | ORAL | Status: DC
Start: 2011-07-10 — End: 2012-03-04

## 2011-07-10 MED ORDER — ALPRAZOLAM 0.5 MG PO TABS: 0.5000 mg | ORAL_TABLET | Freq: Three times a day (TID) | ORAL | Status: AC | PRN

## 2011-07-10 NOTE — Assessment & Plan Note (Signed)
Blood pressure is elevated today because patient has not been taking medications. Encourage better compliance with her medications. Recently, at urgent care, she was changed from carvedilol to atenolol. Encouraged her that this medication sometimes helps with anxiety as well. Followup in one month.

## 2011-07-10 NOTE — Assessment & Plan Note (Signed)
Symptoms improved with Etodolac.  Will continue.

## 2011-07-10 NOTE — Assessment & Plan Note (Signed)
Symptoms recently worsening.  Pt has not been able to tolerate SSRIs other than Prozac in the past.  Will try starting amitriptyline at night and using xanax tid prn for panic. Will stop clonazepam given oversedation with this. Follow up 2 weeks.

## 2011-07-10 NOTE — Progress Notes (Signed)
Subjective:    Patient ID: Elizabeth Dunn, female    DOB: 1961-01-10, 51 y.o.   MRN: [id number]  HPI  51 year old female with history of anxiety and panic disorder presents for acute visit complaining of severe anxiety. She notes that recently over the last few months her anxiety has been increasing. She denies any recent events or situations at home or work precipitating this. Her symptoms were so severe last weekend, that she was unable to move and was shaking. Her husband brought her to [facility] emergency room where she was evaluated and diagnosis made of panic attack. They recommended that she followup with a psychiatrist. She reports that she would prefer not to see a psychiatrist. She notes that she has been taking Clonopin, typically once per day with no improvement in her symptoms. She has also been taking Prozac with no improvement. Her anxiety seems worse in the morning. She was concerned that her blood pressure medicine might be making her symptoms worse so she stopped this medication. She reports having difficulty in her day-to-day function. She reports that if she is asked to do something outside of her routine icterus or into a sense of panic and overwhelming dread. During these times, she is not able to function.  Outpatient Encounter Prescriptions as of 07/10/2011  Medication Sig Dispense Refill  . atenolol (TENORMIN) 25 MG tablet Take 25 mg by mouth 2 (two) times daily.      . clonazePAM (KLONOPIN) 1 MG tablet Take 1 tablet (1 mg total) by mouth 3 (three) times daily.  90 tablet  3  . estradiol (ESTRACE) 0.1 MG/GM vaginal cream Place 1 g vaginally daily as needed.        Marland Kitchen FLUoxetine (PROZAC) 20 MG capsule Take 20 mg by mouth daily.        Marland Kitchen lisinopril (PRINIVIL,ZESTRIL) 20 MG tablet Take 1 tablet (20 mg total) by mouth daily.  30 tablet  11  . nystatin (NYSTOP) 100000 UNIT/GM POWD Apply 1 g (100,000 Units total) topically 2 (two) times daily.  60 g  0  . triamcinolone (KENALOG) 0.025 %  ointment Apply topically 2 (two) times daily.  30 g  0    Review of Systems  Constitutional: Negative for fever, chills, appetite change, fatigue and unexpected weight change.  HENT: Negative for ear pain, congestion, sore throat, trouble swallowing, neck pain, voice change and sinus pressure.   Eyes: Negative for visual disturbance.  Respiratory: Positive for shortness of breath. Negative for cough, wheezing and stridor.   Cardiovascular: Positive for chest pain. Negative for palpitations and leg swelling.  Gastrointestinal: Negative for nausea, vomiting, abdominal pain, diarrhea, constipation, blood in stool, abdominal distention and anal bleeding.  Genitourinary: Negative for dysuria and flank pain.  Musculoskeletal: Negative for myalgias, arthralgias and gait problem.  Skin: Negative for color change and rash.  Neurological: Positive for tremors. Negative for dizziness and headaches.  Hematological: Negative for adenopathy. Does not bruise/bleed easily.  Psychiatric/Behavioral: Positive for sleep disturbance. Negative for suicidal ideas and dysphoric mood. The patient is nervous/anxious.    BP 156/82  Pulse 101  Temp(Src) 98.2 F (36.8 C) (Oral)  Ht 5\' 7"  (1.702 m)  Wt 183 lb (83.008 kg)  BMI 28.66 kg/m2  SpO2 100%     Objective:   Physical Exam  Constitutional: She is oriented to person, place, and time. She appears well-developed and well-nourished. No distress.  HENT:  Head: Normocephalic and atraumatic.  Right Ear: External ear normal.  Left Ear: External ear  normal.  Nose: Nose normal.  Mouth/Throat: Oropharynx is clear and moist. No oropharyngeal exudate.  Eyes: Conjunctivae are normal. Pupils are equal, round, and reactive to light. Right eye exhibits no discharge. Left eye exhibits no discharge. No scleral icterus.  Neck: Normal range of motion. Neck supple. No tracheal deviation present. No thyromegaly present.  Cardiovascular: Normal rate, regular rhythm, normal  heart sounds and intact distal pulses.  Exam reveals no gallop and no friction rub.   No murmur heard. Pulmonary/Chest: Effort normal and breath sounds normal. No respiratory distress. She has no wheezes. She has no rales. She exhibits no tenderness.  Musculoskeletal: Normal range of motion. She exhibits no edema and no tenderness.  Lymphadenopathy:    She has no cervical adenopathy.  Neurological: She is alert and oriented to person, place, and time. No cranial nerve deficit. She exhibits normal muscle tone. Coordination normal.  Skin: Skin is warm and dry. No rash noted. She is not diaphoretic. No erythema. No pallor.  Psychiatric: Her speech is normal and behavior is normal. Judgment and thought content normal. Her mood appears anxious. Cognition and memory are normal.          Assessment & Plan:

## 2011-07-29 ENCOUNTER — Encounter: Payer: Self-pay | Admitting: Internal Medicine

## 2011-08-01 ENCOUNTER — Ambulatory Visit (INDEPENDENT_AMBULATORY_CARE_PROVIDER_SITE_OTHER): Payer: BC Managed Care – PPO | Admitting: Internal Medicine

## 2011-08-01 ENCOUNTER — Encounter: Payer: Self-pay | Admitting: Internal Medicine

## 2011-08-01 VITALS — BP 140/80 | HR 92 | Temp 98.6°F | Ht 67.0 in | Wt 180.0 lb

## 2011-08-01 DIAGNOSIS — J0100 Acute maxillary sinusitis, unspecified: Secondary | ICD-10-CM

## 2011-08-01 MED ORDER — AMOXICILLIN-POT CLAVULANATE 875-125 MG PO TABS: 1.0000 | ORAL_TABLET | Freq: Two times a day (BID) | ORAL | Status: AC

## 2011-08-01 NOTE — Progress Notes (Signed)
Subjective:    Patient ID: Elizabeth Dunn, female    DOB: 1960-10-31, 51 y.o.   MRN: [id number]  HPI 51 year old female with history of hypertension presents for acute visit complaining of three-week history of nasal congestion, sinus pressure, and cough with purulent sputum. She has been using over-the-counter cough and cold medications including Mucinex with no improvement. She denies any fever or chills. She does report frontal sinus headache. She also reports pressure sensation behind her eyes. She denies any shortness of breath.  Outpatient Encounter Prescriptions as of 08/01/2011  Medication Sig Dispense Refill  . ALPRAZolam (XANAX) 0.5 MG tablet Take 1 tablet (0.5 mg total) by mouth 3 (three) times daily as needed for sleep.  90 tablet  3  . amitriptyline (ELAVIL) 25 MG tablet Take 1 tablet (25 mg total) by mouth at bedtime.  30 tablet  3  . atenolol (TENORMIN) 25 MG tablet Take 25 mg by mouth 2 (two) times daily.      . clonazePAM (KLONOPIN) 1 MG tablet Take 1 tablet (1 mg total) by mouth 3 (three) times daily.  90 tablet  3  . estradiol (ESTRACE) 0.1 MG/GM vaginal cream Place 1 g vaginally daily as needed.        . etodolac (LODINE) 500 MG tablet Take 1 tablet (500 mg total) by mouth 2 (two) times daily.  60 tablet  11  . FLUoxetine (PROZAC) 20 MG capsule Take 20 mg by mouth daily.        Marland Kitchen lisinopril (PRINIVIL,ZESTRIL) 20 MG tablet Take 1 tablet (20 mg total) by mouth daily.  30 tablet  11  . nystatin (NYSTOP) 100000 UNIT/GM POWD Apply 1 g (100,000 Units total) topically 2 (two) times daily.  60 g  0  . triamcinolone (KENALOG) 0.025 % ointment Apply topically 2 (two) times daily.  30 g  0  . Venlafaxine HCl (EFFEXOR PO) Take by mouth.      Marland Kitchen amoxicillin-clavulanate (AUGMENTIN) 875-125 MG per tablet Take 1 tablet by mouth 2 (two) times daily.  20 tablet  0    Review of Systems  Constitutional: Negative for fever, chills and unexpected weight change.  HENT: Positive for congestion,  rhinorrhea, postnasal drip and sinus pressure. Negative for hearing loss, ear pain, nosebleeds, sore throat, facial swelling, sneezing, mouth sores, trouble swallowing, neck pain, neck stiffness, voice change, tinnitus and ear discharge.   Eyes: Negative for pain, discharge, redness and visual disturbance.  Respiratory: Positive for cough. Negative for chest tightness, shortness of breath, wheezing and stridor.   Cardiovascular: Negative for chest pain, palpitations and leg swelling.  Musculoskeletal: Negative for myalgias and arthralgias.  Skin: Negative for color change and rash.  Neurological: Negative for dizziness, weakness, light-headedness and headaches.  Hematological: Negative for adenopathy.   BP 140/80  Pulse 92  Temp(Src) 98.6 F (37 C) (Oral)  Ht 5\' 7"  (1.702 m)  Wt 180 lb (81.647 kg)  BMI 28.19 kg/m2  SpO2 99%     Objective:   Physical Exam  Constitutional: She is oriented to person, place, and time. She appears well-developed and well-nourished. No distress.  HENT:  Head: Normocephalic and atraumatic.  Right Ear: External ear normal. Tympanic membrane is erythematous and bulging. A middle ear effusion is present.  Left Ear: External ear normal. Tympanic membrane is not bulging. A middle ear effusion is present.  Nose: Mucosal edema and rhinorrhea present. Right sinus exhibits frontal sinus tenderness. Left sinus exhibits frontal sinus tenderness.  Mouth/Throat: Oropharynx is clear and moist.  No oropharyngeal exudate.  Eyes: Conjunctivae are normal. Pupils are equal, round, and reactive to light. Right eye exhibits no discharge. Left eye exhibits no discharge. No scleral icterus.  Neck: Normal range of motion. Neck supple. No tracheal deviation present. No thyromegaly present.  Cardiovascular: Normal rate, regular rhythm, normal heart sounds and intact distal pulses.  Exam reveals no gallop and no friction rub.   No murmur heard. Pulmonary/Chest: Effort normal and breath  sounds normal. No respiratory distress. She has no wheezes. She has no rales. She exhibits no tenderness.  Musculoskeletal: Normal range of motion. She exhibits no edema and no tenderness.  Lymphadenopathy:    She has no cervical adenopathy.  Neurological: She is alert and oriented to person, place, and time. No cranial nerve deficit. She exhibits normal muscle tone. Coordination normal.  Skin: Skin is warm and dry. No rash noted. She is not diaphoretic. No erythema. No pallor.  Psychiatric: She has a normal mood and affect. Her behavior is normal. Judgment and thought content normal.          Assessment & Plan:

## 2011-08-01 NOTE — Patient Instructions (Signed)
Use Ibuprofen 800mg  every 8hr as needed for sinus pain/inflammation. Stop Lodine while taking Ibuprofen.  Start augmentin tonight.  Call if symptoms aren't improving by Monday.

## 2011-08-01 NOTE — Assessment & Plan Note (Signed)
Symptoms consistent with acute maxillary and frontal sinusitis. Will treat with Augmentin x10 days. Patient will use ibuprofen for pain and inflammation. She will followup if symptoms are not improving. If no improvement would favor adding prednisone taper.

## 2011-08-05 ENCOUNTER — Encounter: Payer: Self-pay | Admitting: Internal Medicine

## 2011-08-20 ENCOUNTER — Encounter: Payer: Self-pay | Admitting: Internal Medicine

## 2011-08-25 ENCOUNTER — Telehealth: Payer: Self-pay | Admitting: Internal Medicine

## 2011-08-25 MED ORDER — ANTIPYRINE-BENZOCAINE 5.4-1.4 % OT SOLN: 3.0000 [drp] | OTIC | Status: AC | PRN

## 2011-08-25 NOTE — Telephone Encounter (Signed)
Addended by: Jobie Quaker on: 08/25/2011 03:35 PM   Modules accepted: Orders

## 2011-08-25 NOTE — Telephone Encounter (Signed)
Tried calling patient, but there was bad reception and phone was cutting in and out. Was disconnected. I will try again later.

## 2011-08-25 NOTE — Telephone Encounter (Signed)
I Elizabeth Dunn that she will need to be seen. Need to see if infection cleared.

## 2011-08-25 NOTE — Telephone Encounter (Signed)
Patient called back and appt was scheduled. She is asking if there is anything she can take until she is seen to help with the pain, or maybe some drops that she can use.

## 2011-08-25 NOTE — Telephone Encounter (Signed)
Ear pain hasn't went away since her last appointment she was giving antibiotic, which she states the ear pain was better but was still there.  Will you please advise, she would like something called in but I advised her you would more than likely need to see her again.

## 2011-08-25 NOTE — Telephone Encounter (Signed)
[phone] Pt called to say that her right ear is still not better.  She has a lot of pain and now its running down her neck. When pt was on antibotic it started to feel better can you call her in something else [facility]  [phone]

## 2011-08-25 NOTE — Telephone Encounter (Signed)
We can call in Auralgan drops to use prn.

## 2011-08-25 NOTE — Telephone Encounter (Signed)
Left detailed message advising patient that rx was called in for her. Rx sent to pharmacy.

## 2011-08-25 NOTE — Telephone Encounter (Signed)
Yes, needs to be seen, as I need to look at ear to see if still infected.

## 2011-08-28 ENCOUNTER — Encounter: Payer: Self-pay | Admitting: Internal Medicine

## 2011-08-28 ENCOUNTER — Ambulatory Visit (INDEPENDENT_AMBULATORY_CARE_PROVIDER_SITE_OTHER): Payer: BC Managed Care – PPO | Admitting: Internal Medicine

## 2011-08-28 VITALS — BP 126/66 | HR 96 | Temp 97.9°F | Resp 16 | Wt 183.2 lb

## 2011-08-28 DIAGNOSIS — J320 Chronic maxillary sinusitis: Secondary | ICD-10-CM

## 2011-08-28 MED ORDER — LEVOFLOXACIN 750 MG PO TABS: 750.0000 mg | ORAL_TABLET | Freq: Every day | ORAL | Status: AC

## 2011-08-28 MED ORDER — PREDNISONE (PAK) 10 MG PO TABS: ORAL_TABLET | ORAL | Status: AC

## 2011-08-28 NOTE — Assessment & Plan Note (Signed)
Suspect chronic sinusitis leading to recurrent OM and ear pain.  Will treat with prednisone and Levaquin. Will set up ENT evaluation. Question if obstruction may be leading to recurrent infection. Pt will call if symptoms not improving.

## 2011-08-28 NOTE — Progress Notes (Signed)
Subjective:    Patient ID: Elizabeth Dunn, female    DOB: [birth year], 51 y.o.   MRN: [id number]  HPI 51YO female presents for acute visit c/o 2 week history of right ear pain. Was treated for right sided OM 1 month ago with augmentin. Had improvement in symptoms, but then symptoms recurred. Now having persistent right ear pain/pressure. No fever, chills.  Pos nasal congestion, sinus pressure and edema.  No cough or dyspnea.  Has been taking some OTC cold meds with no improvement.  Outpatient Encounter Prescriptions as of 08/28/2011  Medication Sig Dispense Refill  . acetaminophen (TYLENOL) 500 MG tablet Take 1,500 mg by mouth every 6 (six) hours as needed.      . ALPRAZolam (XANAX) 0.5 MG tablet Take 0.5 mg by mouth daily as needed.      Marland Kitchen antipyrine-benzocaine (AURALGAN) otic solution Place 3 drops into both ears every 2 (two) hours as needed for pain.  10 mL  0  . atenolol (TENORMIN) 25 MG tablet Take 25 mg by mouth 2 (two) times daily.      Marland Kitchen estradiol (ESTRACE) 0.1 MG/GM vaginal cream Place 1 g vaginally daily as needed.        . etodolac (LODINE) 500 MG tablet Take 1 tablet (500 mg total) by mouth 2 (two) times daily.  60 tablet  11  . lisinopril (PRINIVIL,ZESTRIL) 20 MG tablet Take 1 tablet (20 mg total) by mouth daily.  30 tablet  11  . nystatin (NYSTOP) 100000 UNIT/GM POWD Apply 1 g (100,000 Units total) topically 2 (two) times daily.  60 g  0  . triamcinolone (KENALOG) 0.025 % ointment Apply topically 2 (two) times daily.  30 g  0  . Venlafaxine HCl (EFFEXOR PO) Take by mouth.      Marland Kitchen amitriptyline (ELAVIL) 25 MG tablet Take 1 tablet (25 mg total) by mouth at bedtime.  30 tablet  3  . clonazePAM (KLONOPIN) 1 MG tablet Take 1 tablet (1 mg total) by mouth 3 (three) times daily.  90 tablet  3  . FLUoxetine (PROZAC) 20 MG capsule Take 20 mg by mouth daily.        Marland Kitchen levofloxacin (LEVAQUIN) 750 MG tablet Take 1 tablet (750 mg total) by mouth daily.  7 tablet  0  . predniSONE (STERAPRED UNI-PAK)  10 MG tablet Take 60mg  day 1 then taper by 10mg  daily  21 tablet  0   BP 126/66  Pulse 96  Temp(Src) 97.9 F (36.6 C) (Oral)  Resp 16  Wt 183 lb 4 oz (83.122 kg)  SpO2 99%  Review of Systems  Constitutional: Negative for fever, chills and unexpected weight change.  HENT: Positive for ear pain, congestion and sinus pressure. Negative for hearing loss, nosebleeds, sore throat, facial swelling, rhinorrhea, sneezing, mouth sores, trouble swallowing, neck pain, neck stiffness, voice change, postnasal drip, tinnitus and ear discharge.   Eyes: Negative for pain, discharge, redness and visual disturbance.  Respiratory: Negative for cough, chest tightness, shortness of breath and wheezing.   Cardiovascular: Negative for chest pain, palpitations and leg swelling.  Musculoskeletal: Negative for myalgias and arthralgias.  Skin: Negative for color change and rash.  Neurological: Negative for dizziness, weakness, light-headedness and headaches.  Hematological: Negative for adenopathy.       Objective:   Physical Exam  Constitutional: She is oriented to person, place, and time. She appears well-developed and well-nourished. No distress.  HENT:  Head: Normocephalic and atraumatic.  Right Ear: External ear normal. Tympanic membrane  is bulging. Tympanic membrane is not erythematous. A middle ear effusion is present.  Left Ear: External ear normal. Tympanic membrane is not erythematous and not bulging.  No middle ear effusion.  Nose: Mucosal edema present. Right sinus exhibits maxillary sinus tenderness. Left sinus exhibits maxillary sinus tenderness.  Mouth/Throat: Oropharynx is clear and moist. No oropharyngeal exudate.  Eyes: Conjunctivae are normal. Pupils are equal, round, and reactive to light. Right eye exhibits no discharge. Left eye exhibits no discharge. No scleral icterus.  Neck: Normal range of motion. Neck supple. No tracheal deviation present. No thyromegaly present.  Cardiovascular:  Normal rate, regular rhythm, normal heart sounds and intact distal pulses.  Exam reveals no gallop and no friction rub.   No murmur heard. Pulmonary/Chest: Effort normal and breath sounds normal. No respiratory distress. She has no wheezes. She has no rales. She exhibits no tenderness.  Musculoskeletal: Normal range of motion. She exhibits no edema and no tenderness.  Lymphadenopathy:    She has no cervical adenopathy.  Neurological: She is alert and oriented to person, place, and time. No cranial nerve deficit. She exhibits normal muscle tone. Coordination normal.  Skin: Skin is warm and dry. No rash noted. She is not diaphoretic. No erythema. No pallor.  Psychiatric: She has a normal mood and affect. Her behavior is normal. Judgment and thought content normal.          Assessment & Plan:

## 2011-09-01 NOTE — Telephone Encounter (Signed)
Patient came in on 08/28/11.

## 2011-10-17 ENCOUNTER — Ambulatory Visit (INDEPENDENT_AMBULATORY_CARE_PROVIDER_SITE_OTHER): Payer: BC Managed Care – PPO | Admitting: Internal Medicine

## 2011-10-17 ENCOUNTER — Encounter: Payer: Self-pay | Admitting: Internal Medicine

## 2011-10-17 VITALS — BP 160/102 | HR 98 | Temp 98.5°F | Ht 67.0 in | Wt 184.2 lb

## 2011-10-17 DIAGNOSIS — L989 Disorder of the skin and subcutaneous tissue, unspecified: Secondary | ICD-10-CM

## 2011-10-17 DIAGNOSIS — J0100 Acute maxillary sinusitis, unspecified: Secondary | ICD-10-CM

## 2011-10-17 DIAGNOSIS — I10 Essential (primary) hypertension: Secondary | ICD-10-CM

## 2011-10-17 MED ORDER — LEVOFLOXACIN 750 MG PO TABS: 750.0000 mg | ORAL_TABLET | Freq: Every day | ORAL | Status: DC

## 2011-10-17 NOTE — Assessment & Plan Note (Signed)
Symptoms consistent with recurrent sinusitis. Will treat with Levaquin. If symptoms are not improving over the next 72 hours, would favor ENT evaluation.

## 2011-10-17 NOTE — Progress Notes (Addendum)
Subjective:    Patient ID: Elizabeth Dunn, female    DOB: Dec 24, 1960, 51 y.o.   MRN: [id number]  HPI  51 year old female with history of anxiety, hypertension presents for acute visit complaining of 2 weeks of sinus pressure, headache pain, fatigue. She went to another primary care provider 2 weeks ago who reportedly did an exam and told her symptoms were most consistent with allergies. She has been taking over-the-counter allergy medication and using a steroid eye drop with no improvement. She reports pain across her cheeks and forehead. She also notes some puffiness under her eyes. She has had minimal nasal drainage. She denies any cough, fever, chills, shortness of breath.  In regards to her hypertension, she reports that she stopped both of her blood pressure medications because she was concerned about potential weight gain. She has been off her medications for about 2 weeks.  Outpatient Encounter Prescriptions as of 10/17/2011  Medication Sig Dispense Refill  . acetaminophen (TYLENOL) 500 MG tablet Take 1,500 mg by mouth every 6 (six) hours as needed.      . ALPRAZolam (XANAX) 0.5 MG tablet Take 0.5 mg by mouth daily as needed.      Marland Kitchen amitriptyline (ELAVIL) 25 MG tablet Take 1 tablet (25 mg total) by mouth at bedtime.  30 tablet  3  . atenolol (TENORMIN) 25 MG tablet Take 25 mg by mouth 2 (two) times daily.      . clonazePAM (KLONOPIN) 1 MG tablet Take 1 tablet (1 mg total) by mouth 3 (three) times daily.  90 tablet  3  . estradiol (ESTRACE) 0.1 MG/GM vaginal cream Place 1 g vaginally daily as needed.        . etodolac (LODINE) 500 MG tablet Take 1 tablet (500 mg total) by mouth 2 (two) times daily.  60 tablet  11  . FLUoxetine (PROZAC) 20 MG capsule Take 20 mg by mouth daily.        Marland Kitchen levofloxacin (LEVAQUIN) 750 MG tablet Take 1 tablet (750 mg total) by mouth daily.  7 tablet  0  . lisinopril (PRINIVIL,ZESTRIL) 20 MG tablet Take 1 tablet (20 mg total) by mouth daily.  30 tablet  11  .  nystatin (NYSTOP) 100000 UNIT/GM POWD Apply 1 g (100,000 Units total) topically 2 (two) times daily.  60 g  0  . triamcinolone (KENALOG) 0.025 % ointment Apply topically 2 (two) times daily.  30 g  0  . Venlafaxine HCl (EFFEXOR PO) Take by mouth.       BP 160/102  Pulse 98  Temp 98.5 F (36.9 C) (Oral)  Ht 5\' 7"  (1.702 m)  Wt 184 lb 4 oz (83.575 kg)  BMI 28.86 kg/m2  SpO2 97%  Review of Systems  Constitutional: Positive for fatigue. Negative for fever, chills, appetite change and unexpected weight change.  HENT: Positive for congestion, rhinorrhea, postnasal drip and sinus pressure. Negative for ear pain, sore throat, trouble swallowing, neck pain and voice change.   Eyes: Negative for visual disturbance.  Respiratory: Negative for cough, shortness of breath, wheezing and stridor.   Cardiovascular: Negative for chest pain, palpitations and leg swelling.  Gastrointestinal: Negative for nausea, vomiting, abdominal pain, diarrhea, constipation, blood in stool, abdominal distention and anal bleeding.  Genitourinary: Negative for dysuria and flank pain.  Musculoskeletal: Negative for myalgias, arthralgias and gait problem.  Skin: Negative for color change and rash.  Neurological: Positive for headaches. Negative for dizziness.  Hematological: Negative for adenopathy. Does not bruise/bleed easily.  Psychiatric/Behavioral: Negative  for suicidal ideas, disturbed wake/sleep cycle and dysphoric mood. The patient is not nervous/anxious.        Objective:   Physical Exam  Constitutional: She is oriented to person, place, and time. She appears well-developed and well-nourished. No distress.  HENT:  Head: Normocephalic and atraumatic.  Right Ear: External ear normal. Tympanic membrane is erythematous and bulging. A middle ear effusion is present.  Left Ear: External ear normal. Tympanic membrane is erythematous and bulging. A middle ear effusion is present.  Nose: Mucosal edema present. Right  sinus exhibits maxillary sinus tenderness and frontal sinus tenderness. Left sinus exhibits maxillary sinus tenderness and frontal sinus tenderness.  Mouth/Throat: Oropharynx is clear and moist. No oropharyngeal exudate.  Eyes: Conjunctivae are normal. Pupils are equal, round, and reactive to light. Right eye exhibits no discharge. Left eye exhibits no discharge. No scleral icterus.  Neck: Normal range of motion. Neck supple. No tracheal deviation present. No thyromegaly present.  Cardiovascular: Normal rate, regular rhythm, normal heart sounds and intact distal pulses.  Exam reveals no gallop and no friction rub.   No murmur heard. Pulmonary/Chest: Effort normal and breath sounds normal. No respiratory distress. She has no wheezes. She has no rales. She exhibits no tenderness.  Musculoskeletal: Normal range of motion. She exhibits no edema and no tenderness.  Lymphadenopathy:    She has no cervical adenopathy.  Neurological: She is alert and oriented to person, place, and time. No cranial nerve deficit. She exhibits normal muscle tone. Coordination normal.  Skin: Skin is warm and dry. Rash noted. She is not diaphoretic. There is erythema. No pallor.     Psychiatric: She has a normal mood and affect. Her behavior is normal. Judgment and thought content normal.          Assessment & Plan:

## 2011-10-17 NOTE — Patient Instructions (Addendum)
Please email with update next week. If no improvement, will set up ENT evaluation.  [email or web address]

## 2011-10-17 NOTE — Assessment & Plan Note (Signed)
Lesion is most consistent with eczema, however atypical location. SCC would also be consideration. Will set up with dermatology for evaluation and possible biopsy.

## 2011-10-17 NOTE — Assessment & Plan Note (Signed)
Patient stopped taking blood pressure medicine because she was concerned about weight gain. Blood pressure elevated today. Strongly encouraged her to resume her medication. She will followup in one month for blood pressure recheck.

## 2011-10-21 ENCOUNTER — Encounter: Payer: Self-pay | Admitting: Internal Medicine

## 2011-10-21 DIAGNOSIS — J0100 Acute maxillary sinusitis, unspecified: Secondary | ICD-10-CM

## 2011-10-22 ENCOUNTER — Encounter: Payer: Self-pay | Admitting: Internal Medicine

## 2011-10-22 MED ORDER — LEVOFLOXACIN 750 MG PO TABS: 750.0000 mg | ORAL_TABLET | Freq: Every day | ORAL | Status: AC

## 2011-12-06 ENCOUNTER — Encounter: Payer: Self-pay | Admitting: Internal Medicine

## 2012-03-04 ENCOUNTER — Ambulatory Visit (INDEPENDENT_AMBULATORY_CARE_PROVIDER_SITE_OTHER): Payer: BC Managed Care – PPO | Admitting: Internal Medicine

## 2012-03-04 ENCOUNTER — Encounter: Payer: Self-pay | Admitting: Internal Medicine

## 2012-03-04 VITALS — BP 184/100 | HR 96 | Temp 98.3°F | Ht 67.0 in | Wt 186.2 lb

## 2012-03-04 DIAGNOSIS — F419 Anxiety disorder, unspecified: Secondary | ICD-10-CM

## 2012-03-04 DIAGNOSIS — N959 Unspecified menopausal and perimenopausal disorder: Secondary | ICD-10-CM

## 2012-03-04 DIAGNOSIS — R002 Palpitations: Secondary | ICD-10-CM

## 2012-03-04 DIAGNOSIS — I10 Essential (primary) hypertension: Secondary | ICD-10-CM

## 2012-03-04 DIAGNOSIS — F411 Generalized anxiety disorder: Secondary | ICD-10-CM

## 2012-03-04 MED ORDER — AMLODIPINE BESYLATE 5 MG PO TABS: 5.0000 mg | ORAL_TABLET | Freq: Every day | ORAL | Status: DC

## 2012-03-04 MED ORDER — CLONAZEPAM 1 MG PO TABS: 1.0000 mg | ORAL_TABLET | Freq: Three times a day (TID) | ORAL | Status: DC

## 2012-03-04 NOTE — Progress Notes (Signed)
Subjective:    Patient ID: Elizabeth Dunn, female    DOB: 1960-07-27, 51 y.o.   MRN: [id number]  HPI 51 year old female with history of hypertension, anxiety presents for acute visit complaining of worsening symptoms of anxiety and palpitations. She reports that she stopped taking her blood pressure medicine including atenolol and lisinopril several months ago because she felt that this medication made her feel tired. Since that time, she has had frequent episodes of palpitations and increasing sense of anxiety. She denies any headache or chest pain. Notably, she was seen by another primary care physician and was started on Effexor. She has tapered her Effexor to 37.5 mg daily. She was hoping to get off this medication and stay on Prozac alone. She questions whether she may be going through menopause and this may be contributing to symptoms. She would like to have hormone levels tested.  Outpatient Encounter Prescriptions as of 03/04/2012  Medication Sig Dispense Refill  . acetaminophen (TYLENOL) 500 MG tablet Take 1,500 mg by mouth every 6 (six) hours as needed.      . ALPRAZolam (XANAX) 0.5 MG tablet Take 0.5 mg by mouth daily as needed.      . etodolac (LODINE) 500 MG tablet Take 1 tablet (500 mg total) by mouth 2 (two) times daily.  60 tablet  11  . FLUoxetine (PROZAC) 20 MG capsule Take 20 mg by mouth daily.      . [DISCONTINUED] Venlafaxine HCl (EFFEXOR PO) Take by mouth.      Marland Kitchen amLODipine (NORVASC) 5 MG tablet Take 1 tablet (5 mg total) by mouth daily.  30 tablet  3  . clonazePAM (KLONOPIN) 1 MG tablet Take 1 tablet (1 mg total) by mouth 3 (three) times daily.  90 tablet  3  . lisinopril (PRINIVIL,ZESTRIL) 20 MG tablet Take 1 tablet (20 mg total) by mouth daily.  30 tablet  11   BP 184/100  Pulse 96  Temp 98.3 F (36.8 C) (Oral)  Ht 5\' 7"  (1.702 m)  Wt 186 lb 4 oz (84.482 kg)  BMI 29.17 kg/m2  SpO2 97%  Review of Systems  Constitutional: Positive for diaphoresis. Negative for fever,  chills, appetite change, fatigue and unexpected weight change.  HENT: Negative for ear pain, congestion, sore throat, trouble swallowing, neck pain, voice change and sinus pressure.   Eyes: Negative for visual disturbance.  Respiratory: Negative for cough, shortness of breath, wheezing and stridor.   Cardiovascular: Positive for palpitations. Negative for chest pain and leg swelling.  Gastrointestinal: Negative for nausea, vomiting, abdominal pain, diarrhea, constipation, blood in stool, abdominal distention and anal bleeding.  Genitourinary: Negative for dysuria and flank pain.  Musculoskeletal: Negative for myalgias, arthralgias and gait problem.  Skin: Negative for color change and rash.  Neurological: Negative for dizziness and headaches.  Hematological: Negative for adenopathy. Does not bruise/bleed easily.  Psychiatric/Behavioral: Negative for suicidal ideas, sleep disturbance and dysphoric mood. The patient is nervous/anxious.        Objective:   Physical Exam  Constitutional: She is oriented to person, place, and time. She appears well-developed and well-nourished. No distress.  HENT:  Head: Normocephalic and atraumatic.  Right Ear: External ear normal.  Left Ear: External ear normal.  Nose: Nose normal.  Mouth/Throat: Oropharynx is clear and moist. No oropharyngeal exudate.  Eyes: Conjunctivae normal are normal. Pupils are equal, round, and reactive to light. Right eye exhibits no discharge. Left eye exhibits no discharge. No scleral icterus.  Neck: Normal range of motion. Neck supple.  No tracheal deviation present. No thyromegaly present.  Cardiovascular: Normal rate, regular rhythm, normal heart sounds and intact distal pulses.  Exam reveals no gallop and no friction rub.   No murmur heard. Pulmonary/Chest: Effort normal and breath sounds normal. No respiratory distress. She has no wheezes. She has no rales. She exhibits no tenderness.  Musculoskeletal: Normal range of motion.  She exhibits no edema and no tenderness.  Lymphadenopathy:    She has no cervical adenopathy.  Neurological: She is alert and oriented to person, place, and time. No cranial nerve deficit. She exhibits normal muscle tone. Coordination normal.  Skin: Skin is warm and dry. No rash noted. She is not diaphoretic. No erythema. No pallor.  Psychiatric: She has a normal mood and affect. Her behavior is normal. Judgment and thought content normal.          Assessment & Plan:

## 2012-03-04 NOTE — Assessment & Plan Note (Signed)
Will check LH and FSH with labs.

## 2012-03-04 NOTE — Assessment & Plan Note (Signed)
Blood pressure markedly elevated today. Patient has been noncompliant with medications. We discussed the potential risk of untreated elevated blood pressure including heart attack and stroke. Will start patient on amlodipine 5 mg daily. Patient will return to clinic for blood pressure check on Tuesday. In the interim, she will monitor blood pressure at home and call if blood pressure consistently greater than 140/90. She will also call or return to clinic immediately if she develops headache, chest pain, or persistent palpitations. Strongly encouraged better compliance with medication in the future.

## 2012-03-04 NOTE — Assessment & Plan Note (Addendum)
Symptoms of anxiety recently worsened. Patient has tapered down on Effexor. Will stop this medication. Will continue Prozac. We discussed potentially increasing dose of Prozac to 40 mg daily in the next few weeks. However, will first start amlodipine and monitor symptoms with better control of BP. Will restart clonazepam to help with acute episodes of anxiety. Follow up 4 weeks.

## 2012-03-05 ENCOUNTER — Other Ambulatory Visit: Payer: Self-pay | Admitting: Internal Medicine

## 2012-03-05 ENCOUNTER — Telehealth: Payer: Self-pay | Admitting: *Deleted

## 2012-03-05 DIAGNOSIS — E878 Other disorders of electrolyte and fluid balance, not elsewhere classified: Secondary | ICD-10-CM

## 2012-03-05 LAB — CBC WITH DIFFERENTIAL/PLATELET
Basophils Relative: 0.8 % (ref 0.0–3.0)
Eosinophils Relative: 1.9 % (ref 0.0–5.0)
HCT: 40.5 % (ref 36.0–46.0)
Lymphs Abs: 1.5 10*3/uL (ref 0.7–4.0)
MCV: 94.6 fl (ref 78.0–100.0)
Monocytes Absolute: 0.4 10*3/uL (ref 0.1–1.0)
Platelets: 348 10*3/uL (ref 150.0–400.0)
WBC: 7.1 10*3/uL (ref 4.5–10.5)

## 2012-03-05 LAB — COMPREHENSIVE METABOLIC PANEL
CO2: 28 mEq/L (ref 19–32)
Creatinine, Ser: 0.7 mg/dL (ref 0.4–1.2)
GFR: 95.23 mL/min (ref >60.00)
Glucose, Bld: 79 mg/dL (ref 70–99)
Total Bilirubin: 0.7 mg/dL (ref 0.3–1.2)

## 2012-03-05 LAB — TSH: TSH: 1.6 u[IU]/mL (ref 0.35–5.50)

## 2012-03-05 LAB — MAGNESIUM: Magnesium: 2.1 mg/dL (ref 1.5–2.5)

## 2012-03-05 NOTE — Telephone Encounter (Signed)
Per Dr. Lorin Picket I advised patient to increase Norvasc to 5mg  twice daily and to not check her BP so frequently because it tend to be elevated if you check it several times back to back.  Patient advised that Dr. Lorin Picket may be calling her later this evening with her potassium lab results.

## 2012-03-05 NOTE — Telephone Encounter (Signed)
See other message.  FYI.  I called her and left her a message that her potassium was normal.  Instructed her to spot check her blood pressure (giving some time in between checks).  If any problems - call or be evaluated.

## 2012-03-07 ENCOUNTER — Encounter: Payer: Self-pay | Admitting: Internal Medicine

## 2012-03-08 ENCOUNTER — Other Ambulatory Visit: Payer: Self-pay | Admitting: Internal Medicine

## 2012-03-08 MED ORDER — CARVEDILOL 6.25 MG PO TABS: 6.2500 mg | ORAL_TABLET | Freq: Two times a day (BID) | ORAL | Status: DC

## 2012-03-12 ENCOUNTER — Encounter: Payer: Self-pay | Admitting: Internal Medicine

## 2012-03-19 ENCOUNTER — Telehealth: Payer: Self-pay | Admitting: *Deleted

## 2012-03-19 NOTE — Telephone Encounter (Signed)
Returned Laurie's phone call with [employer] in regards to patient; she needed to update patients info and it was done previously.

## 2012-04-01 ENCOUNTER — Ambulatory Visit: Payer: BC Managed Care – PPO | Admitting: Internal Medicine

## 2012-04-05 ENCOUNTER — Encounter: Payer: Self-pay | Admitting: Internal Medicine

## 2012-04-26 ENCOUNTER — Encounter: Payer: Self-pay | Admitting: Internal Medicine

## 2012-04-26 MED ORDER — ALPRAZOLAM 0.5 MG PO TABS: 0.5000 mg | ORAL_TABLET | Freq: Three times a day (TID) | ORAL | Status: DC | PRN

## 2012-05-31 ENCOUNTER — Encounter: Payer: Self-pay | Admitting: Internal Medicine

## 2012-06-29 ENCOUNTER — Encounter: Payer: Self-pay | Admitting: Internal Medicine

## 2012-07-15 ENCOUNTER — Other Ambulatory Visit: Payer: Self-pay | Admitting: Internal Medicine

## 2012-12-12 LAB — HM PAP SMEAR

## 2013-02-16 ENCOUNTER — Ambulatory Visit: Payer: BC Managed Care – PPO | Admitting: Internal Medicine

## 2013-03-03 ENCOUNTER — Other Ambulatory Visit: Payer: Self-pay

## 2013-10-26 ENCOUNTER — Telehealth: Payer: Self-pay | Admitting: Internal Medicine

## 2013-12-12 ENCOUNTER — Encounter: Payer: Self-pay | Admitting: Internal Medicine

## 2013-12-12 ENCOUNTER — Ambulatory Visit (INDEPENDENT_AMBULATORY_CARE_PROVIDER_SITE_OTHER): Payer: BC Managed Care – PPO | Admitting: Internal Medicine

## 2013-12-12 VITALS — BP 182/78 | HR 112 | Temp 98.2°F | Ht 68.5 in | Wt 203.0 lb

## 2013-12-12 DIAGNOSIS — I10 Essential (primary) hypertension: Secondary | ICD-10-CM

## 2013-12-12 DIAGNOSIS — E669 Obesity, unspecified: Secondary | ICD-10-CM | POA: Insufficient documentation

## 2013-12-12 DIAGNOSIS — F411 Generalized anxiety disorder: Secondary | ICD-10-CM

## 2013-12-12 DIAGNOSIS — F419 Anxiety disorder, unspecified: Secondary | ICD-10-CM

## 2013-12-12 DIAGNOSIS — G2581 Restless legs syndrome: Secondary | ICD-10-CM

## 2013-12-12 LAB — COMPREHENSIVE METABOLIC PANEL
ALT: 26 U/L (ref 0–35)
AST: 22 U/L (ref 0–37)
Albumin: 4 g/dL (ref 3.5–5.2)
Alkaline Phosphatase: 66 U/L (ref 39–117)
BILIRUBIN TOTAL: 0.6 mg/dL (ref 0.2–1.2)
BUN: 18 mg/dL (ref 6–23)
CO2: 26 meq/L (ref 19–32)
CREATININE: 0.7 mg/dL (ref 0.4–1.2)
Calcium: 9.1 mg/dL (ref 8.4–10.5)
Chloride: 104 mEq/L (ref 96–112)
GFR: 93.02 mL/min (ref >60.00)
GLUCOSE: 119 mg/dL — AB (ref 70–99)
Potassium: 4.2 mEq/L (ref 3.5–5.1)
Sodium: 138 mEq/L (ref 135–145)
TOTAL PROTEIN: 6.6 g/dL (ref 6.0–8.3)

## 2013-12-12 LAB — LIPID PANEL
Cholesterol: 187 mg/dL (ref 0–200)
HDL: 49.4 mg/dL
LDL Cholesterol: 114 mg/dL — ABNORMAL HIGH (ref 0–99)
NonHDL: 137.6
Total CHOL/HDL Ratio: 4
Triglycerides: 116 mg/dL (ref 0.0–149.0)
VLDL: 23.2 mg/dL (ref 0.0–40.0)

## 2013-12-12 LAB — HM MAMMOGRAPHY

## 2013-12-12 MED ORDER — CLONAZEPAM 0.5 MG PO TABS: 0.5000 mg | ORAL_TABLET | Freq: Every day | ORAL | Status: DC

## 2013-12-12 MED ORDER — AMLODIPINE BESYLATE 5 MG PO TABS: 5.0000 mg | ORAL_TABLET | Freq: Every day | ORAL | Status: DC

## 2013-12-12 MED ORDER — ROPINIROLE HCL 1 MG PO TABS: 1.0000 mg | ORAL_TABLET | Freq: Every day | ORAL | Status: DC

## 2013-12-12 NOTE — Progress Notes (Signed)
Pre visit review using our clinic review tool, if applicable. No additional management support is needed unless otherwise documented below in the visit note. 

## 2013-12-12 NOTE — Progress Notes (Signed)
Subjective:    Patient ID: Elizabeth Dunn, female    DOB: 22-Oct-1960, 53 y.o.   MRN: [id number]  HPI 53YO female presents for follow up.  HTN - Has not been taking medications. Feels generally bad when she takes Lisinopril. When taking HCTZ, breaks out in sweat. Does not feel that Atenolol was helpful. Never tried Amlodipine. When checking BP at home, typically near 140-160/90s. No chest pain or palpitations, except when feeling anxious notes rapid heart rate.  Anxiety - worse recently dealing with her elderly mother who has been living alone. Taking occasional Clonazepam at night with some improvement in sleep and anxiety.  Seen and evaluated at [facility] for restless legs. Started on Requip and Neurontin with minimal improvement. Described as occasional involuntary movements of her legs.  Review of Systems  Constitutional: Negative for fever, chills, appetite change, fatigue and unexpected weight change.  Eyes: Negative for visual disturbance.  Respiratory: Negative for shortness of breath.   Cardiovascular: Positive for palpitations. Negative for chest pain and leg swelling.  Gastrointestinal: Negative for abdominal pain.  Musculoskeletal: Positive for myalgias.  Skin: Negative for color change and rash.  Neurological: Positive for headaches.  Hematological: Negative for adenopathy. Does not bruise/bleed easily.  Psychiatric/Behavioral: Positive for sleep disturbance. Negative for dysphoric mood. The patient is nervous/anxious.        Objective:    BP 182/78  Pulse 112  Temp(Src) 98.2 F (36.8 C) (Oral)  Ht 5' 8.5" (1.74 m)  Wt 203 lb (92.08 kg)  BMI 30.41 kg/m2  SpO2 97% Physical Exam  Constitutional: She is oriented to person, place, and time. She appears well-developed and well-nourished. No distress.  HENT:  Head: Normocephalic and atraumatic.  Right Ear: External ear normal.  Left Ear: External ear normal.  Nose: Nose normal.  Mouth/Throat: Oropharynx is clear and  moist. No oropharyngeal exudate.  Eyes: Conjunctivae are normal. Pupils are equal, round, and reactive to light. Right eye exhibits no discharge. Left eye exhibits no discharge. No scleral icterus.  Neck: Normal range of motion. Neck supple. No tracheal deviation present. No thyromegaly present.  Cardiovascular: Normal rate, regular rhythm, normal heart sounds and intact distal pulses.  Exam reveals no gallop and no friction rub.   No murmur heard. Pulmonary/Chest: Effort normal and breath sounds normal. No accessory muscle usage. Not tachypneic. No respiratory distress. She has no decreased breath sounds. She has no wheezes. She has no rhonchi. She has no rales. She exhibits no tenderness.  Musculoskeletal: Normal range of motion. She exhibits no edema and no tenderness.  Lymphadenopathy:    She has no cervical adenopathy.  Neurological: She is alert and oriented to person, place, and time. No cranial nerve deficit. She exhibits normal muscle tone. Coordination normal.  Skin: Skin is warm and dry. No rash noted. She is not diaphoretic. No erythema. No pallor.  Psychiatric: Her behavior is normal. Judgment and thought content normal. Her mood appears anxious.          Assessment & Plan:   Problem List Items Addressed This Visit     Unprioritized   Anxiety     Recent worsening of anxiety. Likely contributing to elevated BP. Will increase Clonazepam to 0.5mg  po bid prn. Follow up in 2-4 weeks.    Hypertension - Primary      BP Readings from Last 3 Encounters:  12/12/13 182/78  03/04/12 184/100  10/17/11 160/102   BP elevated. Pt not compliant with medications. Discussed importance of compliance. Will start  back on Amlodipine 5mg  daily. RTC in 2-4 weeks. Renal function with labs today.    Relevant Medications      amLODIpine (NORVASC) tablet   Other Relevant Orders      Comprehensive metabolic panel      Lipid panel   Restless leg syndrome     Will stop Neurontin and increase  Requip to 1mg  at bedtime. Follow up in 2- 4 weeks.    Relevant Medications      rOPINIRole (REQUIP) tablet       Return in about 4 weeks (around 01/09/2014) for Recheck.

## 2013-12-12 NOTE — Assessment & Plan Note (Signed)
Will stop Neurontin and increase Requip to 1mg  at bedtime. Follow up in 2- 4 weeks.

## 2013-12-12 NOTE — Assessment & Plan Note (Signed)
BP Readings from Last 3 Encounters:  12/12/13 182/78  03/04/12 184/100  10/17/11 160/102   BP elevated. Pt not compliant with medications. Discussed importance of compliance. Will start back on Amlodipine 5mg  daily. RTC in 2-4 weeks. Renal function with labs today.

## 2013-12-12 NOTE — Patient Instructions (Addendum)
Stop Neurontin.  Increase Requip to 1mg  at bedtime.  Restart Amlodipine 5mg  daily.  Follow up 4 weeks.

## 2013-12-12 NOTE — Assessment & Plan Note (Signed)
Recent worsening of anxiety. Likely contributing to elevated BP. Will increase Clonazepam to 0.5mg  po bid prn. Follow up in 2-4 weeks.

## 2013-12-13 ENCOUNTER — Telehealth: Payer: Self-pay | Admitting: Internal Medicine

## 2013-12-13 NOTE — Telephone Encounter (Signed)
Relevant patient education assigned to patient using Emmi. ° °

## 2013-12-30 ENCOUNTER — Telehealth: Payer: Self-pay | Admitting: Internal Medicine

## 2013-12-30 NOTE — Telephone Encounter (Signed)
Pt came in and dropped off a health examination certificate for the [employer] to be filled out by Dr. Dan Humphreys. Placed in box.

## 2014-01-10 NOTE — Telephone Encounter (Signed)
Left vm requesting pt to return my call, Paperwork is ready for her to pick up

## 2014-01-18 ENCOUNTER — Ambulatory Visit: Payer: Self-pay | Admitting: Internal Medicine

## 2014-01-26 ENCOUNTER — Telehealth: Payer: Self-pay

## 2014-01-26 ENCOUNTER — Telehealth: Payer: Self-pay | Admitting: *Deleted

## 2014-01-26 NOTE — Telephone Encounter (Signed)
Left vm notifying pt to call the office to schedule an appt

## 2014-01-26 NOTE — Telephone Encounter (Signed)
Advised pt that I left her a message a few weeks ago letting her know that form was ready for pick up.

## 2014-01-26 NOTE — Telephone Encounter (Signed)
The patient called hoping to get an update on her physical form.  She stated it was dropped off several weeks ago.

## 2014-01-26 NOTE — Telephone Encounter (Signed)
Needs a follow up visit.

## 2014-01-26 NOTE — Telephone Encounter (Signed)
Pt states that she feels that rOPINIRole (REQUIP) 1 MG tablet is causing her to retain water.  Asking if there is another medication that she can try

## 2014-01-30 ENCOUNTER — Telehealth: Payer: Self-pay | Admitting: *Deleted

## 2014-01-30 MED ORDER — CLONAZEPAM 0.5 MG PO TABS
0.5000 mg | ORAL_TABLET | Freq: Two times a day (BID) | ORAL | Status: DC
Start: 2014-01-30 — End: 2014-04-11

## 2014-01-30 NOTE — Telephone Encounter (Signed)
Printed Rx for signature 

## 2014-01-30 NOTE — Addendum Note (Signed)
Addended by: Chandra BatchNIXON, Mckinze Poirier E on: 01/30/2014 03:24 PM   Modules accepted: Orders

## 2014-01-30 NOTE — Telephone Encounter (Signed)
Pt left message requesting a refill on clonazePAM (KLONOPIN) 0.5 MG tablet, Okay to refill?

## 2014-01-30 NOTE — Telephone Encounter (Signed)
OK To refill?

## 2014-01-31 NOTE — Telephone Encounter (Signed)
Rx sent to pharmacy   

## 2014-03-13 ENCOUNTER — Telehealth: Payer: Self-pay | Admitting: Internal Medicine

## 2014-03-13 ENCOUNTER — Ambulatory Visit (INDEPENDENT_AMBULATORY_CARE_PROVIDER_SITE_OTHER): Payer: Self-pay | Admitting: Internal Medicine

## 2014-03-13 ENCOUNTER — Encounter: Payer: Self-pay | Admitting: Internal Medicine

## 2014-03-13 VITALS — BP 138/72 | HR 95 | Temp 98.4°F | Ht 68.5 in | Wt 214.8 lb

## 2014-03-13 DIAGNOSIS — T887XXA Unspecified adverse effect of drug or medicament, initial encounter: Secondary | ICD-10-CM

## 2014-03-13 DIAGNOSIS — F419 Anxiety disorder, unspecified: Secondary | ICD-10-CM

## 2014-03-13 DIAGNOSIS — T50905A Adverse effect of unspecified drugs, medicaments and biological substances, initial encounter: Secondary | ICD-10-CM | POA: Insufficient documentation

## 2014-03-13 DIAGNOSIS — I10 Essential (primary) hypertension: Secondary | ICD-10-CM

## 2014-03-13 DIAGNOSIS — E669 Obesity, unspecified: Secondary | ICD-10-CM

## 2014-03-13 LAB — CBC
HCT: 39.6 % (ref 36.0–46.0)
Hemoglobin: 13.3 g/dL (ref 12.0–15.0)
MCHC: 33.5 g/dL (ref 30.0–36.0)
MCV: 94.3 fl (ref 78.0–100.0)
Platelets: 339 10*3/uL (ref 150.0–400.0)
RBC: 4.2 Mil/uL (ref 3.87–5.11)
RDW: 13.2 % (ref 11.5–15.5)
WBC: 11 10*3/uL — ABNORMAL HIGH (ref 4.0–10.5)

## 2014-03-13 MED ORDER — TRAMADOL HCL 50 MG PO TABS: 50.0000 mg | ORAL_TABLET | Freq: Every evening | ORAL | Status: DC | PRN

## 2014-03-13 NOTE — Assessment & Plan Note (Signed)
BP Readings from Last 3 Encounters:  03/13/14 138/72  12/12/13 182/78  03/04/12 184/100   BP well controlled. Pt stopped Amlodipine and started back on her Atenolol. Will continue. Renal function with labs today.

## 2014-03-13 NOTE — Telephone Encounter (Signed)
Harriett SineNancy @ [facility] wants Ms Kostelnik's bill for today visit 03/13/14 and her next visit coming up for any extra labs and test due to pharmacy error sent to  [facility] [address] [place] [id number]   [facility]  would like Dr Dan HumphreysWalker to call her @ [phone]

## 2014-03-13 NOTE — Assessment & Plan Note (Signed)
Rx was called to pharmacy for Requip, however it was filled for Risperdone. Discussed with pt today. Will STOP Risperdone. Discussed side effects of this medication. Will check CMP, CBC, EKG with labs today.

## 2014-03-13 NOTE — Patient Instructions (Addendum)
STOP Risperdal. We will call pharmacy to identify who called this in.  Labs today.  Follow up in 3 months and as needed.

## 2014-03-13 NOTE — Addendum Note (Signed)
Addended by: Ronna PolioWALKER, Michalla Ringer A on: 03/13/2014 11:39 AM   Modules accepted: Orders

## 2014-03-13 NOTE — Progress Notes (Signed)
Pre visit review using our clinic review tool, if applicable. No additional management support is needed unless otherwise documented below in the visit note. 

## 2014-03-13 NOTE — Assessment & Plan Note (Signed)
Symptoms well controlled. Continue Fluoxetine and Clonazepam.

## 2014-03-13 NOTE — Progress Notes (Signed)
Subjective:    Patient ID: Elizabeth Dunn, female    DOB: 1960-05-30, 53 y.o.   MRN: [id number]  HPI 53YO female presents for follow up.  [facility] with Doxycycline. Continues to have some dry cough and mild dyspnea. No fever, chills, chest pain.  Stopped Amlodipine because of ankle swelling. Went back on Atenolol 100mg  daily which she had been on before. Tolerating well.  Started on Fluoxetine by Dr. Letta KocherWillet. Had this left over from previous prescription. Was also taking Risperdal, but unsure who prescribed this?  Wt Readings from Last 3 Encounters:  03/13/14 214 lb 12 oz (97.41 kg)  12/12/13 203 lb (92.08 kg)  03/04/12 186 lb 4 oz (84.482 kg)     Review of Systems  Constitutional: Positive for fatigue. Negative for fever, chills, appetite change and unexpected weight change.  Eyes: Negative for visual disturbance.  Respiratory: Positive for cough and shortness of breath.   Cardiovascular: Negative for chest pain and leg swelling.  Gastrointestinal: Negative for vomiting, abdominal pain, diarrhea and constipation.  Musculoskeletal: Negative for myalgias and arthralgias.  Skin: Negative for color change and rash.  Hematological: Negative for adenopathy. Does not bruise/bleed easily.  Psychiatric/Behavioral: Negative for sleep disturbance and dysphoric mood. The patient is not nervous/anxious.        Objective:    BP 138/72 mmHg  Pulse 95  Temp(Src) 98.4 F (36.9 C) (Oral)  Ht 5' 8.5" (1.74 m)  Wt 214 lb 12 oz (97.41 kg)  BMI 32.17 kg/m2  SpO2 97% Physical Exam  Constitutional: She is oriented to person, place, and time. She appears well-developed and well-nourished. No distress.  HENT:  Head: Normocephalic and atraumatic.  Right Ear: External ear normal.  Left Ear: External ear normal.  Nose: Nose normal.  Mouth/Throat: Oropharynx is clear and moist. No oropharyngeal exudate.  Eyes: Conjunctivae are normal. Pupils are equal,  round, and reactive to light. Right eye exhibits no discharge. Left eye exhibits no discharge. No scleral icterus.  Neck: Normal range of motion. Neck supple. No tracheal deviation present. No thyromegaly present.  Cardiovascular: Normal rate, regular rhythm, normal heart sounds and intact distal pulses.  Exam reveals no gallop and no friction rub.   No murmur heard. Pulmonary/Chest: Effort normal and breath sounds normal. No accessory muscle usage. No tachypnea. No respiratory distress. She has no decreased breath sounds. She has no wheezes. She has no rhonchi. She has no rales. She exhibits no tenderness.  Musculoskeletal: Normal range of motion. She exhibits no edema or tenderness.  Lymphadenopathy:    She has no cervical adenopathy.  Neurological: She is alert and oriented to person, place, and time. No cranial nerve deficit. She exhibits normal muscle tone. Coordination normal.  Skin: Skin is warm and dry. No rash noted. She is not diaphoretic. No erythema. No pallor.  Psychiatric: She has a normal mood and affect. Her behavior is normal. Judgment and thought content normal.          Assessment & Plan:   Problem List Items Addressed This Visit      Unprioritized   Anxiety    Symptoms well controlled. Continue Fluoxetine and Clonazepam.    Relevant Medications      FLUoxetine (PROZAC) 20 MG tablet   Hypertension - Primary    BP Readings from Last 3 Encounters:  03/13/14 138/72  12/12/13 182/78  03/04/12 184/100   BP well controlled. Pt stopped Amlodipine and started back on her Atenolol. Will  continue. Renal function with labs today.    Relevant Medications      atenolol (TENORMIN) 100 MG tablet   Other Relevant Orders      Comprehensive metabolic panel      TSH      CBC   Medication adverse effect    Rx was called to pharmacy for Requip, however it was filled for Risperdone. Discussed with pt today. Will STOP Risperdone. Discussed side effects of this medication. Will  check CMP, CBC, EKG with labs today.    Relevant Orders      EKG 12-Lead   Obesity (BMI 30-39.9)    Wt Readings from Last 3 Encounters:  03/13/14 214 lb 12 oz (97.41 kg)  12/12/13 203 lb (92.08 kg)  03/04/12 186 lb 4 oz (84.482 kg)    Suspect recent weight gain related to use of Risperdal. Nurse, Diamantina MonksJulie Eastwood, discussed with pharmacy, and they mis-filled Rx for Requip as Risperdone. Explained to patient. Will STOP Risperdone.  Encouraged healthy diet and exercise. Follow up in 3 months and prn.        Return in about 3 months (around 06/13/2014) for Recheck.

## 2014-03-13 NOTE — Assessment & Plan Note (Signed)
Wt Readings from Last 3 Encounters:  03/13/14 214 lb 12 oz (97.41 kg)  12/12/13 203 lb (92.08 kg)  03/04/12 186 lb 4 oz (84.482 kg)    Suspect recent weight gain related to use of Risperdal. Nurse, Diamantina MonksJulie Eastwood, discussed with pharmacy, and they mis-filled Rx for Requip as Risperdone. Explained to patient. Will STOP Risperdone.  Encouraged healthy diet and exercise. Follow up in 3 months and prn.

## 2014-03-13 NOTE — Telephone Encounter (Signed)
Elizabeth Dunn, Can you help with this?

## 2014-03-14 ENCOUNTER — Telehealth: Payer: Self-pay | Admitting: *Deleted

## 2014-03-14 LAB — COMPREHENSIVE METABOLIC PANEL
ALBUMIN: 4.3 g/dL (ref 3.5–5.2)
ALT: 29 U/L (ref 0–35)
AST: 25 U/L (ref 0–37)
Alkaline Phosphatase: 77 U/L (ref 39–117)
BILIRUBIN TOTAL: 0.5 mg/dL (ref 0.2–1.2)
BUN: 19 mg/dL (ref 6–23)
CO2: 18 meq/L — AB (ref 19–32)
Calcium: 9.2 mg/dL (ref 8.4–10.5)
Chloride: 107 mEq/L (ref 96–112)
Creatinine, Ser: 0.8 mg/dL (ref 0.4–1.2)
GFR: 84.51 mL/min (ref >60.00)
GLUCOSE: 80 mg/dL (ref 70–99)
POTASSIUM: 5 meq/L (ref 3.5–5.1)
Sodium: 137 mEq/L (ref 135–145)
TOTAL PROTEIN: 7.3 g/dL (ref 6.0–8.3)

## 2014-03-14 LAB — TSH: TSH: 2.02 u[IU]/mL (ref 0.35–4.50)

## 2014-03-14 NOTE — Telephone Encounter (Signed)
[facility] wants Elizabeth Dunn's bill for today visit 03/13/14 and her next visit coming up for any extra labs and test due to a pharmacy error

## 2014-03-15 NOTE — Telephone Encounter (Signed)
error 

## 2014-03-20 ENCOUNTER — Telehealth: Payer: Self-pay | Admitting: Internal Medicine

## 2014-03-20 ENCOUNTER — Other Ambulatory Visit: Payer: Self-pay | Admitting: *Deleted

## 2014-03-20 MED ORDER — ATENOLOL 100 MG PO TABS: 100.0000 mg | ORAL_TABLET | Freq: Every day | ORAL | Status: DC

## 2014-03-20 NOTE — Telephone Encounter (Signed)
Rx refilled.

## 2014-03-20 NOTE — Telephone Encounter (Signed)
atenolol (TENORMIN) 100 MG tablet

## 2014-03-20 NOTE — Telephone Encounter (Signed)
Called and [facility] with [facility] [phone].   Advised patient will be billed for services, and she will need to contact the patient directly for the information she is requesting.

## 2014-04-11 ENCOUNTER — Encounter: Payer: Self-pay | Admitting: Internal Medicine

## 2014-04-11 ENCOUNTER — Ambulatory Visit (INDEPENDENT_AMBULATORY_CARE_PROVIDER_SITE_OTHER): Payer: Self-pay | Admitting: Internal Medicine

## 2014-04-11 VITALS — BP 146/88 | HR 81 | Temp 97.3°F | Ht 68.5 in | Wt 207.2 lb

## 2014-04-11 DIAGNOSIS — F419 Anxiety disorder, unspecified: Secondary | ICD-10-CM

## 2014-04-11 MED ORDER — CLONAZEPAM 0.5 MG PO TABS: 0.5000 mg | ORAL_TABLET | Freq: Two times a day (BID) | ORAL | Status: DC | PRN

## 2014-04-11 MED ORDER — BUPROPION HCL ER (XL) 150 MG PO TB24: 150.0000 mg | ORAL_TABLET | Freq: Every day | ORAL | Status: DC

## 2014-04-11 NOTE — Progress Notes (Signed)
Pre visit review using our clinic review tool, if applicable. No additional management support is needed unless otherwise documented below in the visit note. 

## 2014-04-11 NOTE — Patient Instructions (Addendum)
Limit Fluoxetine to 20mg  daily.  Start Wellbutrin 150mg  daily in the morning.  Continue Clonazepam 0.5-1 mg up to three times daily as needed for anxiety.  Follow up 4 weeks.

## 2014-04-11 NOTE — Assessment & Plan Note (Signed)
Worsening anxiety after recent transition off Risperdal. Will add Wellbutrin. Limit Fluoxetine to 20mg  daily. Continue Clonazpam 0.5-1mg  po tid prn. Follow up in 4 weeks or sooner as needed.

## 2014-04-11 NOTE — Progress Notes (Signed)
Subjective:    Patient ID: Elizabeth Dunn, female    DOB: [birth year], 53 y.o.   MRN: [id number]  HPI 53YO female presents to follow up recent medication error in which she was prescribed Requip, but given Risperdal.  At last visit she was concerned about weight gain and generalized fatigue.   Wt Readings from Last 3 Encounters:  04/11/14 207 lb 4 oz (94.008 kg)  03/13/14 214 lb 12 oz (97.41 kg)  12/12/13 203 lb (92.08 kg)   Feeling more anxious lately. Taking Fluoxetine 40mg  daily and Clonazepam with minimal improvement. On one occasion felt so anxious that she could not go to work.   Past medical, surgical, family and social history per today's encounter.  Review of Systems  Constitutional: Negative for fever, chills, appetite change, fatigue and unexpected weight change.  Eyes: Negative for visual disturbance.  Respiratory: Negative for shortness of breath.   Cardiovascular: Negative for chest pain and leg swelling.  Gastrointestinal: Negative for nausea, vomiting, abdominal pain, diarrhea and constipation.  Musculoskeletal: Negative for myalgias and arthralgias.  Skin: Negative for color change and rash.  Hematological: Negative for adenopathy. Does not bruise/bleed easily.  Psychiatric/Behavioral: Negative for suicidal ideas, sleep disturbance and dysphoric mood. The patient is nervous/anxious.        Objective:    BP 146/88 mmHg  Pulse 81  Temp(Src) 97.3 F (36.3 C) (Oral)  Ht 5' 8.5" (1.74 m)  Wt 207 lb 4 oz (94.008 kg)  BMI 31.05 kg/m2  SpO2 98% Physical Exam  Constitutional: She is oriented to person, place, and time. She appears well-developed and well-nourished. No distress.  HENT:  Head: Normocephalic and atraumatic.  Right Ear: External ear normal.  Left Ear: External ear normal.  Nose: Nose normal.  Mouth/Throat: Oropharynx is clear and moist. No oropharyngeal exudate.  Eyes: Conjunctivae are normal. Pupils are equal, round, and reactive to light.  Right eye exhibits no discharge. Left eye exhibits no discharge. No scleral icterus.  Neck: Normal range of motion. Neck supple. No tracheal deviation present. No thyromegaly present.  Cardiovascular: Normal rate, regular rhythm, normal heart sounds and intact distal pulses.  Exam reveals no gallop and no friction rub.   No murmur heard. Pulmonary/Chest: Effort normal and breath sounds normal. No accessory muscle usage. No tachypnea. No respiratory distress. She has no decreased breath sounds. She has no wheezes. She has no rhonchi. She has no rales. She exhibits no tenderness.  Musculoskeletal: Normal range of motion. She exhibits no edema or tenderness.  Lymphadenopathy:    She has no cervical adenopathy.  Neurological: She is alert and oriented to person, place, and time. No cranial nerve deficit. She exhibits normal muscle tone. Coordination normal.  Skin: Skin is warm and dry. No rash noted. She is not diaphoretic. No erythema. No pallor.  Psychiatric: Her speech is normal and behavior is normal. Judgment and thought content normal. Her mood appears anxious. She expresses no suicidal ideation.          Assessment & Plan:  Over 25min of which >50% spent in face-to-face contact with patient discussing plan of care  Problem List Items Addressed This Visit      Unprioritized   Anxiety - Primary    Worsening anxiety after recent transition off Risperdal. Will add Wellbutrin. Limit Fluoxetine to 20mg  daily. Continue Clonazpam 0.5-1mg  po tid prn. Follow up in 4 weeks or sooner as needed.    Relevant Medications      buPROPion (WELLBUTRIN XL) 24 hr  tablet       Return in about 4 weeks (around 05/09/2014) for Recheck.

## 2014-04-19 ENCOUNTER — Other Ambulatory Visit: Payer: Self-pay | Admitting: *Deleted

## 2014-04-19 MED ORDER — GABAPENTIN 100 MG PO CAPS: 200.0000 mg | ORAL_CAPSULE | Freq: Every day | ORAL | Status: DC

## 2014-05-16 ENCOUNTER — Ambulatory Visit: Payer: Self-pay | Admitting: Internal Medicine

## 2014-06-20 ENCOUNTER — Ambulatory Visit: Payer: Self-pay | Admitting: Internal Medicine

## 2014-07-10 ENCOUNTER — Other Ambulatory Visit: Payer: Self-pay | Admitting: Internal Medicine

## 2014-07-10 NOTE — Telephone Encounter (Signed)
Faxed

## 2014-07-10 NOTE — Telephone Encounter (Signed)
Last visit 04/11/14

## 2014-07-25 ENCOUNTER — Encounter: Payer: Self-pay | Admitting: Internal Medicine

## 2014-07-25 ENCOUNTER — Ambulatory Visit (INDEPENDENT_AMBULATORY_CARE_PROVIDER_SITE_OTHER): Payer: BLUE CROSS/BLUE SHIELD | Admitting: Internal Medicine

## 2014-07-25 VITALS — BP 166/81 | HR 109 | Temp 97.3°F | Ht 68.5 in | Wt 213.2 lb

## 2014-07-25 DIAGNOSIS — G2581 Restless legs syndrome: Secondary | ICD-10-CM

## 2014-07-25 DIAGNOSIS — E669 Obesity, unspecified: Secondary | ICD-10-CM

## 2014-07-25 DIAGNOSIS — F419 Anxiety disorder, unspecified: Secondary | ICD-10-CM | POA: Diagnosis not present

## 2014-07-25 DIAGNOSIS — I10 Essential (primary) hypertension: Secondary | ICD-10-CM | POA: Diagnosis not present

## 2014-07-25 DIAGNOSIS — L989 Disorder of the skin and subcutaneous tissue, unspecified: Secondary | ICD-10-CM

## 2014-07-25 LAB — COMPREHENSIVE METABOLIC PANEL
ALBUMIN: 4.3 g/dL (ref 3.5–5.2)
ALT: 27 U/L (ref 0–35)
AST: 19 U/L (ref 0–37)
Alkaline Phosphatase: 72 U/L (ref 39–117)
BILIRUBIN TOTAL: 0.4 mg/dL (ref 0.2–1.2)
BUN: 16 mg/dL (ref 6–23)
CHLORIDE: 104 meq/L (ref 96–112)
CO2: 26 meq/L (ref 19–32)
Calcium: 9.5 mg/dL (ref 8.4–10.5)
Creatinine, Ser: 0.68 mg/dL (ref 0.40–1.20)
GFR: 95.95 mL/min (ref >60.00)
Glucose, Bld: 116 mg/dL — ABNORMAL HIGH (ref 70–99)
Potassium: 4 mEq/L (ref 3.5–5.1)
Sodium: 136 mEq/L (ref 135–145)
Total Protein: 7.3 g/dL (ref 6.0–8.3)

## 2014-07-25 LAB — T4, FREE: FREE T4: 0.68 ng/dL (ref 0.60–1.60)

## 2014-07-25 LAB — HEMOGLOBIN A1C: Hgb A1c MFr Bld: 5.7 % (ref 4.6–6.5)

## 2014-07-25 LAB — TSH: TSH: 2.82 u[IU]/mL (ref 0.35–4.50)

## 2014-07-25 MED ORDER — GABAPENTIN 300 MG PO CAPS: 300.0000 mg | ORAL_CAPSULE | Freq: Every day | ORAL | Status: DC

## 2014-07-25 MED ORDER — BUPROPION HCL ER (XL) 300 MG PO TB24
300.0000 mg | ORAL_TABLET | Freq: Every day | ORAL | Status: DC
Start: 2014-07-25 — End: 2014-10-10

## 2014-07-25 NOTE — Assessment & Plan Note (Signed)
Wt Readings from Last 3 Encounters:  07/25/14 213 lb 4 oz (96.73 kg)  04/11/14 207 lb 4 oz (94.008 kg)  03/13/14 214 lb 12 oz (97.41 kg)   Body mass index is 31.95 kg/(m^2). Encouraged her to keep a food diary and limit calories to 1200 daily. Encouraged her to set goal of walking 60min 5 days per week. Will set up nutrition evaluation. Will check thyroid function and blood sugars today.

## 2014-07-25 NOTE — Assessment & Plan Note (Signed)
BP Readings from Last 3 Encounters:  07/25/14 166/81  04/11/14 146/88  03/13/14 138/72   BP elevated today however did not take medication. Encouraged compliance with meds. Renal function with labs. Recheck BP in 4 weeks.

## 2014-07-25 NOTE — Assessment & Plan Note (Signed)
Symptoms improved with Wellbutrin. She would like to stop Prozac because of weight gain. Will stop Prozac and increase Wellbutrin to 300mg  daily. Recheck in 4 weeks.

## 2014-07-25 NOTE — Assessment & Plan Note (Signed)
She is concerned that Requip is causing weight gain. Will stop medication and try increasing Neurontin to better control symptoms. Follow up 4 weeks.

## 2014-07-25 NOTE — Assessment & Plan Note (Signed)
Skin lesion right upper arm. Appearance concerning for SCC. Will set up dermatology evaluation for biopsy/excision.

## 2014-07-25 NOTE — Patient Instructions (Addendum)
Stop Requip.  Stop Fluoxetine.  Increase Wellbutrin to 300mg  daily.  Increase Neurontin to 300mg  at bedtime.  We will set up evaluation with nutrition. Please keep a log of calorie intake.  Set goal of exercise 60min 5 days per week.  Labs today.

## 2014-07-25 NOTE — Progress Notes (Signed)
Subjective:    Patient ID: Elizabeth LemonKaren A Dunn, female    DOB: 1960/05/14, 54 y.o.   MRN: [id number]  HPI 54YO female presents for follow up.  HTN - Forgot to take Atenolol last night. No chest pain, headache.  Frustrated by weight. Trying to snack on healthy foods. No regular exercise.  Wt Readings from Last 3 Encounters:  07/25/14 213 lb 4 oz (96.73 kg)  04/11/14 207 lb 4 oz (94.008 kg)  03/13/14 214 lb 12 oz (97.41 kg)   Skin lesion - right upper arm. Present for several years. Has been seen by dermatology in past, but not recently. Scaling. Not painful. Seems to be getting larger.   Past medical, surgical, family and social history per today's encounter.  Review of Systems  Constitutional: Negative for fever, chills, appetite change, fatigue and unexpected weight change.  Eyes: Negative for visual disturbance.  Respiratory: Negative for shortness of breath.   Cardiovascular: Negative for chest pain and leg swelling.  Gastrointestinal: Negative for nausea, vomiting, abdominal pain, diarrhea and constipation.  Musculoskeletal: Negative for myalgias and arthralgias.  Skin: Negative for color change and rash.  Hematological: Negative for adenopathy. Does not bruise/bleed easily.  Psychiatric/Behavioral: Positive for sleep disturbance. Negative for dysphoric mood. The patient is nervous/anxious.        Objective:    BP 166/81 mmHg  Pulse 109  Temp(Src) 97.3 F (36.3 C) (Oral)  Ht 5' 8.5" (1.74 m)  Wt 213 lb 4 oz (96.73 kg)  BMI 31.95 kg/m2  SpO2 98% Physical Exam  Constitutional: She is oriented to person, place, and time. She appears well-developed and well-nourished. No distress.  HENT:  Head: Normocephalic and atraumatic.  Right Ear: External ear normal.  Left Ear: External ear normal.  Nose: Nose normal.  Mouth/Throat: Oropharynx is clear and moist. No oropharyngeal exudate.  Eyes: Conjunctivae are normal. Pupils are equal, round, and reactive to light. Right eye  exhibits no discharge. Left eye exhibits no discharge. No scleral icterus.  Neck: Normal range of motion. Neck supple. No tracheal deviation present. No thyromegaly present.  Cardiovascular: Normal rate, regular rhythm, normal heart sounds and intact distal pulses.  Exam reveals no gallop and no friction rub.   No murmur heard. Pulmonary/Chest: Effort normal and breath sounds normal. No respiratory distress. She has no wheezes. She has no rales. She exhibits no tenderness.  Musculoskeletal: Normal range of motion. She exhibits no edema or tenderness.  Lymphadenopathy:    She has no cervical adenopathy.  Neurological: She is alert and oriented to person, place, and time. No cranial nerve deficit. She exhibits normal muscle tone. Coordination normal.  Skin: Skin is warm and dry. Lesion noted. No rash noted. She is not diaphoretic. There is erythema. No pallor.     Psychiatric: Her speech is normal and behavior is normal. Judgment and thought content normal. Her mood appears anxious. Cognition and memory are normal.          Assessment & Plan:   Problem List Items Addressed This Visit      Unprioritized   Anxiety    Symptoms improved with Wellbutrin. She would like to stop Prozac because of weight gain. Will stop Prozac and increase Wellbutrin to 300mg  daily. Recheck in 4 weeks.      Relevant Medications   buPROPion (WELLBUTRIN XL) 24 hr tablet   Hypertension - Primary    BP Readings from Last 3 Encounters:  07/25/14 166/81  04/11/14 146/88  03/13/14 138/72   BP elevated  today however did not take medication. Encouraged compliance with meds. Renal function with labs. Recheck BP in 4 weeks.      Relevant Orders   Comprehensive metabolic panel   Obesity (BMI 16-10.9)    Wt Readings from Last 3 Encounters:  07/25/14 213 lb 4 oz (96.73 kg)  04/11/14 207 lb 4 oz (94.008 kg)  03/13/14 214 lb 12 oz (97.41 kg)   Body mass index is 31.95 kg/(m^2). Encouraged her to keep a food  diary and limit calories to 1200 daily. Encouraged her to set goal of walking 5 days per week. Will set up nutrition evaluation. Will check thyroid function and blood sugars today.      Relevant Orders   Amb ref to Medical Nutrition Therapy-MNT   T4, free   TSH   Hemoglobin A1c   Restless leg syndrome    She is concerned that Requip is causing weight gain. Will stop medication and try increasing Neurontin to better control symptoms. Follow up 4 weeks.      Skin lesion    Skin lesion right upper arm. Appearance concerning for SCC. Will set up dermatology evaluation for biopsy/excision.      Relevant Orders   Ambulatory referral to Dermatology       Return in about 4 weeks (around 08/22/2014) for Recheck.

## 2014-07-25 NOTE — Progress Notes (Signed)
Pre visit review using our clinic review tool, if applicable. No additional management support is needed unless otherwise documented below in the visit note. 

## 2014-07-27 ENCOUNTER — Encounter: Payer: Self-pay | Admitting: Internal Medicine

## 2014-08-05 ENCOUNTER — Ambulatory Visit
Admit: 2014-08-05 | Disposition: A | Discharge status: Home / Self Care | Payer: Self-pay | Attending: Family Medicine | Admitting: Family Medicine

## 2014-08-05 LAB — COMPREHENSIVE METABOLIC PANEL
ANION GAP: 7 (ref 7–16)
AST: 24 U/L
Albumin: 4.5 g/dL
Alkaline Phosphatase: 73 U/L
BUN: 13 mg/dL
Bilirubin,Total: 0.4 mg/dL
CALCIUM: 9.2 mg/dL
Chloride: 104 mmol/L
Co2: 27 mmol/L
Creatinine: 0.69 mg/dL
EGFR (African American): >60
Glucose: 108 mg/dL — ABNORMAL HIGH
Potassium: 4.9 mmol/L
SGPT (ALT): 27 U/L
Sodium: 138 mmol/L
Total Protein: 7.4 g/dL

## 2014-08-05 LAB — CBC WITH DIFFERENTIAL/PLATELET
BASOS ABS: 0.1 10*3/uL (ref 0.0–0.1)
Basophil %: 1 %
EOS ABS: 0.3 10*3/uL (ref 0.0–0.7)
Eosinophil %: 3.2 %
HCT: 42 % (ref 35.0–47.0)
HGB: 14.3 g/dL (ref 12.0–16.0)
LYMPHS PCT: 19.7 %
Lymphocyte #: 1.7 10*3/uL (ref 1.0–3.6)
MCH: 31.6 pg (ref 26.0–34.0)
MCHC: 34.1 g/dL (ref 32.0–36.0)
MCV: 93 fL (ref 80–100)
Monocyte #: 0.6 x10 3/mm (ref 0.2–0.9)
Monocyte %: 7 %
NEUTROS PCT: 69.1 %
Neutrophil #: 6.1 10*3/uL (ref 1.4–6.5)
PLATELETS: 334 10*3/uL (ref 150–440)
RBC: 4.53 10*6/uL (ref 3.80–5.20)
RDW: 12.9 % (ref 11.5–14.5)
WBC: 8.8 10*3/uL (ref 3.6–11.0)

## 2014-08-05 LAB — URINALYSIS, COMPLETE
BILIRUBIN, UR: NEGATIVE
Glucose,UR: NEGATIVE
Ketone: NEGATIVE
Leukocyte Esterase: NEGATIVE
Nitrite: NEGATIVE
PH: 6 (ref 5.0–8.0)
Protein: NEGATIVE
SPECIFIC GRAVITY: 1.025 (ref 1.000–1.030)

## 2014-08-29 ENCOUNTER — Ambulatory Visit: Payer: BLUE CROSS/BLUE SHIELD | Admitting: Internal Medicine

## 2014-09-08 ENCOUNTER — Other Ambulatory Visit: Payer: Self-pay

## 2014-09-08 MED ORDER — GABAPENTIN 100 MG PO CAPS: 100.0000 mg | ORAL_CAPSULE | Freq: Every day | ORAL | Status: DC

## 2014-09-08 NOTE — Telephone Encounter (Signed)
Patient asked pharmacy to sent request to take 1-3 capsules of 100mg  each night at bedtime.  Please advise?

## 2014-09-23 ENCOUNTER — Other Ambulatory Visit: Payer: Self-pay | Admitting: Internal Medicine

## 2014-10-10 ENCOUNTER — Encounter: Payer: Self-pay | Admitting: Internal Medicine

## 2014-10-10 ENCOUNTER — Ambulatory Visit (INDEPENDENT_AMBULATORY_CARE_PROVIDER_SITE_OTHER): Payer: BLUE CROSS/BLUE SHIELD | Admitting: Internal Medicine

## 2014-10-10 VITALS — BP 152/78 | HR 84 | Temp 97.6°F | Ht 68.5 in | Wt 204.0 lb

## 2014-10-10 DIAGNOSIS — F410 Panic disorder [episodic paroxysmal anxiety] without agoraphobia: Secondary | ICD-10-CM | POA: Diagnosis not present

## 2014-10-10 DIAGNOSIS — I10 Essential (primary) hypertension: Secondary | ICD-10-CM | POA: Diagnosis not present

## 2014-10-10 DIAGNOSIS — F419 Anxiety disorder, unspecified: Secondary | ICD-10-CM

## 2014-10-10 MED ORDER — BUPROPION HCL ER (XL) 300 MG PO TB24: 300.0000 mg | ORAL_TABLET | Freq: Every day | ORAL | Status: DC

## 2014-10-10 MED ORDER — FLUOXETINE HCL 20 MG PO CAPS: 20.0000 mg | ORAL_CAPSULE | Freq: Every day | ORAL | Status: DC

## 2014-10-10 MED ORDER — LOSARTAN POTASSIUM 25 MG PO TABS: 25.0000 mg | ORAL_TABLET | Freq: Every day | ORAL | Status: DC

## 2014-10-10 MED ORDER — CLONAZEPAM 0.5 MG PO TABS: ORAL_TABLET | ORAL | Status: DC

## 2014-10-10 NOTE — Patient Instructions (Signed)
Start Losartan 25mg  daily to help with blood pressure.  Labs in 1 week.

## 2014-10-10 NOTE — Assessment & Plan Note (Signed)
Poorly controlled. Will increase Clonazepam to up to 1mg  po bid. Continue Wellbutrin and Fluoxetine. Follow up in 4 weeks.

## 2014-10-10 NOTE — Assessment & Plan Note (Signed)
Chronic anxiety with episodes of panic. Likely contributing to elevated BP. Will try increasing Clonazepam to up to 1mg  bid. Discussed potential risks of this medication. Continue Wellbutrin. Follow up in 4 weeks.

## 2014-10-10 NOTE — Progress Notes (Signed)
Pre visit review using our clinic review tool, if applicable. No additional management support is needed unless otherwise documented below in the visit note. 

## 2014-10-10 NOTE — Assessment & Plan Note (Signed)
BP Readings from Last 3 Encounters:  10/10/14 152/78  07/25/14 166/81  04/11/14 146/88   BP elevated. Will try to improve control of anxiety by increasing prn Clonazepam. Will also add Losartan 25mg  daily. Recheck Cr and K in 1 week. Follow up iin 4 weeks.

## 2014-10-10 NOTE — Progress Notes (Signed)
Subjective:    Patient ID: Elizabeth Dunn, female    DOB: 03-20-1961, 54 y.o.   MRN: [id number]  HPI  53YO female presents for follow up.  HTN - BP has been more elevated recently with systolic 140-160s. Feels anxious at times. Tried to stop eating meat and bread with no improvement. Compliant with atenolol. Tried HCTZ in the past, but could not tolerate. Also unable to tolerate Lisinopril or Amlodipine.  Feeling more anxious recently. Stressed about money. Taking her Fluoxetine and Clonazepam at night only.  Past medical, surgical, family and social history per today's encounter.  Review of Systems  Constitutional: Negative for fever, chills, appetite change, fatigue and unexpected weight change.  Eyes: Negative for visual disturbance.  Respiratory: Negative for shortness of breath.   Cardiovascular: Negative for chest pain and leg swelling.  Gastrointestinal: Negative for nausea, vomiting, abdominal pain, diarrhea and constipation.  Musculoskeletal: Negative for myalgias and arthralgias.  Skin: Negative for color change and rash.  Hematological: Negative for adenopathy. Does not bruise/bleed easily.  Psychiatric/Behavioral: Negative for suicidal ideas, sleep disturbance and dysphoric mood. The patient is nervous/anxious.        Objective:    BP 152/78 mmHg  Pulse 84  Temp(Src) 97.6 F (36.4 C) (Oral)  Ht 5' 8.5" (1.74 m)  Wt 204 lb (92.534 kg)  BMI 30.56 kg/m2  SpO2 97% Physical Exam  Constitutional: She is oriented to person, place, and time. She appears well-developed and well-nourished. No distress.  HENT:  Head: Normocephalic and atraumatic.  Right Ear: External ear normal.  Left Ear: External ear normal.  Nose: Nose normal.  Mouth/Throat: Oropharynx is clear and moist. No oropharyngeal exudate.  Eyes: Conjunctivae are normal. Pupils are equal, round, and reactive to light. Right eye exhibits no discharge. Left eye exhibits no discharge. No scleral icterus.    Neck: Normal range of motion. Neck supple. No tracheal deviation present. No thyromegaly present.  Cardiovascular: Normal rate, regular rhythm, normal heart sounds and intact distal pulses.  Exam reveals no gallop and no friction rub.   No murmur heard. Pulmonary/Chest: Effort normal and breath sounds normal. No respiratory distress. She has no wheezes. She has no rales. She exhibits no tenderness.  Musculoskeletal: Normal range of motion. She exhibits no edema or tenderness.  Lymphadenopathy:    She has no cervical adenopathy.  Neurological: She is alert and oriented to person, place, and time. No cranial nerve deficit. She exhibits normal muscle tone. Coordination normal.  Skin: Skin is warm and dry. No rash noted. She is not diaphoretic. No erythema. No pallor.  Psychiatric: Her behavior is normal. Judgment and thought content normal. Her mood appears anxious. Her speech is rapid and/or pressured. Cognition and memory are normal.          Assessment & Plan:   Problem List Items Addressed This Visit      Unprioritized   Anxiety    Poorly controlled. Will increase Clonazepam to up to 1mg  po bid. Continue Wellbutrin and Fluoxetine. Follow up in 4 weeks.      Relevant Medications   buPROPion (WELLBUTRIN XL) 300 MG 24 hr tablet   FLUoxetine (PROZAC) 20 MG capsule   Hypertension    BP Readings from Last 3 Encounters:  10/10/14 152/78  07/25/14 166/81  04/11/14 146/88   BP elevated. Will try to improve control of anxiety by increasing prn Clonazepam. Will also add Losartan 25mg  daily. Recheck Cr and K in 1 week. Follow up iin 4 weeks.  Relevant Medications   losartan (COZAAR) 25 MG tablet   Other Relevant Orders   Comprehensive metabolic panel   Panic disorder - Primary    Chronic anxiety with episodes of panic. Likely contributing to elevated BP. Will try increasing Clonazepam to up to  bid. Discussed potential risks of this medication. Continue Wellbutrin. Follow up  in 4 weeks.      Relevant Medications   buPROPion (WELLBUTRIN XL) 300 MG 24 hr tablet   FLUoxetine (PROZAC) 20 MG capsule       Return in about 4 weeks (around 11/07/2014) for Recheck of Blood Pressure.

## 2014-10-17 ENCOUNTER — Other Ambulatory Visit (INDEPENDENT_AMBULATORY_CARE_PROVIDER_SITE_OTHER): Payer: BLUE CROSS/BLUE SHIELD

## 2014-10-17 DIAGNOSIS — I10 Essential (primary) hypertension: Secondary | ICD-10-CM | POA: Diagnosis not present

## 2014-10-17 LAB — COMPREHENSIVE METABOLIC PANEL
ALT: 31 U/L (ref 0–35)
AST: 23 U/L (ref 0–37)
Albumin: 4.5 g/dL (ref 3.5–5.2)
Alkaline Phosphatase: 80 U/L (ref 39–117)
BILIRUBIN TOTAL: 0.5 mg/dL (ref 0.2–1.2)
BUN: 18 mg/dL (ref 6–23)
CALCIUM: 9.7 mg/dL (ref 8.4–10.5)
CO2: 28 mEq/L (ref 19–32)
Chloride: 102 mEq/L (ref 96–112)
Creatinine, Ser: 0.66 mg/dL (ref 0.40–1.20)
GFR: 99.23 mL/min (ref >60.00)
Glucose, Bld: 100 mg/dL — ABNORMAL HIGH (ref 70–99)
Potassium: 4.6 mEq/L (ref 3.5–5.1)
SODIUM: 137 meq/L (ref 135–145)
Total Protein: 7.4 g/dL (ref 6.0–8.3)

## 2014-11-08 ENCOUNTER — Encounter: Payer: Self-pay | Admitting: Internal Medicine

## 2014-11-09 ENCOUNTER — Encounter: Payer: Self-pay | Admitting: Internal Medicine

## 2014-11-09 ENCOUNTER — Telehealth: Payer: Self-pay

## 2014-11-09 ENCOUNTER — Telehealth: Payer: Self-pay | Admitting: *Deleted

## 2014-11-09 NOTE — Telephone Encounter (Signed)
The pt called and is hoping to discuss a new blood pressure medication.   Pt callback - [phone]

## 2014-11-09 NOTE — Telephone Encounter (Signed)
Spoke with Cordovalyde at the pharmacy. Advised Lisinopril would not be filled.  Elizabeth Dunn verbalized understanding.

## 2014-11-09 NOTE — Telephone Encounter (Signed)
We should not fill this. She was taking Losartan based on my records. She occasionally sees another primary care doctor. Perhaps he has prescribed it.

## 2014-11-09 NOTE — Telephone Encounter (Signed)
Fax from pharmacy requesting Lisinopril  tab daily.  Medication not listed on med list.  Please advise refill

## 2014-11-10 ENCOUNTER — Encounter: Payer: Self-pay | Admitting: Internal Medicine

## 2014-11-10 NOTE — Telephone Encounter (Signed)
Spoke with the patient.  Verbalized understanding to not take her husband medication and to increase her Losartan to 50mg .  Reminded her of her appointment on Tuesday and told her that you would discuss things further at the appointment.

## 2014-11-10 NOTE — Telephone Encounter (Signed)
She should increase the Losartan to  daily. STOP TAKING LISINOPRIL.

## 2014-11-10 NOTE — Telephone Encounter (Signed)
Spoke with the patient to get some clarification.  Patient had taken her Losartaan up until last weekend.  She stated that she knew it wasn't working.  Verbalized that she knows not to take others medications but knew since her BP was high yesterday...she needed something.  Took her husbands new Lisinopril prescription yesterday and got great results.  I have scheduled the patient to see you on Tuesday  Afternoon to discuss her BP and medication options.  She is going to track her BP for the next few days until her appointment.  Clarified that the Mychart message dates for her recent bp reading are suppose to be July dates, not June.  Please advise.  She did ask if it was okay to take her husbands if her BP is high (ie. 170's systolic).  I advised her that she should take what she was prescribed, but I would ask for your advice.

## 2014-11-10 NOTE — Telephone Encounter (Signed)
[facility]  Not sure if the pt received a call.

## 2014-11-14 ENCOUNTER — Encounter: Payer: Self-pay | Admitting: Internal Medicine

## 2014-11-14 ENCOUNTER — Ambulatory Visit (INDEPENDENT_AMBULATORY_CARE_PROVIDER_SITE_OTHER): Payer: BLUE CROSS/BLUE SHIELD | Admitting: Internal Medicine

## 2014-11-14 VITALS — BP 149/72 | HR 98 | Temp 98.0°F | Ht 68.5 in | Wt 201.5 lb

## 2014-11-14 DIAGNOSIS — G2581 Restless legs syndrome: Secondary | ICD-10-CM

## 2014-11-14 DIAGNOSIS — R251 Tremor, unspecified: Secondary | ICD-10-CM | POA: Diagnosis not present

## 2014-11-14 DIAGNOSIS — I10 Essential (primary) hypertension: Secondary | ICD-10-CM

## 2014-11-14 MED ORDER — LOSARTAN POTASSIUM 50 MG PO TABS: 50.0000 mg | ORAL_TABLET | Freq: Every day | ORAL | Status: DC

## 2014-11-14 MED ORDER — ROPINIROLE HCL 0.5 MG PO TABS: 0.5000 mg | ORAL_TABLET | Freq: Every day | ORAL | Status: DC

## 2014-11-14 NOTE — Assessment & Plan Note (Signed)
Fine tremor of both hands. Likely worsened by anxiety. Will check thyroid function with labs. Discussed potential referral to neurology given symptoms of both restless legs and tremor. She declines for now.

## 2014-11-14 NOTE — Patient Instructions (Addendum)
Increase Losartan to  daily.  Add Requip 0.5mg  daily at bedtime to help with restless legs and tremor.  Labs today.  Follow up in 1 weeks.

## 2014-11-14 NOTE — Progress Notes (Signed)
Pre visit review using our clinic review tool, if applicable. No additional management support is needed unless otherwise documented below in the visit note. 

## 2014-11-14 NOTE — Assessment & Plan Note (Signed)
BP Readings from Last 3 Encounters:  11/14/14 149/72  10/10/14 152/78  07/25/14 166/81   BP elevated. Will increase Losartan to 50mg  daily. Recheck BP next week. Check renal function with labs today, given her recent use of her husband's lisinopril-HCTZ.

## 2014-11-14 NOTE — Progress Notes (Signed)
Subjective:    Patient ID: Elizabeth Dunn, female    DOB: [birth year], 54 y.o.   MRN: [id number]  HPI  53YO female presents for BP check.  HTN - BP has been elevated, ranging from 130-170s systolic. Briefly took her husband's Lisinopril, but then stopped.  Tremor - Concerned about bilateral hand tremor. Worse since stopping Requip. Initially stopped this medication because of concern about weight gain.  Past medical, surgical, family and social history per today's encounter.  Review of Systems  Constitutional: Positive for fatigue. Negative for fever, chills, appetite change and unexpected weight change.  Eyes: Negative for visual disturbance.  Respiratory: Negative for shortness of breath.   Cardiovascular: Negative for chest pain and leg swelling.  Gastrointestinal: Negative for abdominal pain.  Skin: Negative for color change and rash.  Neurological: Positive for tremors.  Hematological: Negative for adenopathy. Does not bruise/bleed easily.  Psychiatric/Behavioral: Negative for sleep disturbance and dysphoric mood. The patient is nervous/anxious.        Objective:    BP 149/72 mmHg  Pulse 98  Temp(Src) 98 F (36.7 C) (Oral)  Ht 5' 8.5" (1.74 m)  Wt 201 lb 8 oz (91.4 kg)  BMI 30.19 kg/m2  SpO2 97% Physical Exam  Constitutional: She is oriented to person, place, and time. She appears well-developed and well-nourished. No distress.  HENT:  Head: Normocephalic and atraumatic.  Right Ear: External ear normal.  Left Ear: External ear normal.  Nose: Nose normal.  Mouth/Throat: Oropharynx is clear and moist. No oropharyngeal exudate.  Eyes: Conjunctivae are normal. Pupils are equal, round, and reactive to light. Right eye exhibits no discharge. Left eye exhibits no discharge. No scleral icterus.  Neck: Normal range of motion. Neck supple. No tracheal deviation present. No thyromegaly present.  Cardiovascular: Normal rate, regular rhythm, normal heart sounds and intact  distal pulses.  Exam reveals no gallop and no friction rub.   No murmur heard. Pulmonary/Chest: Effort normal and breath sounds normal. No respiratory distress. She has no wheezes. She has no rales. She exhibits no tenderness.  Musculoskeletal: Normal range of motion. She exhibits no edema or tenderness.  Lymphadenopathy:    She has no cervical adenopathy.  Neurological: She is alert and oriented to person, place, and time. She displays tremor (fine resting tremor bilateral hands). She displays no atrophy. No cranial nerve deficit or sensory deficit. She exhibits normal muscle tone. Coordination and gait normal.  Skin: Skin is warm and dry. No rash noted. She is not diaphoretic. No erythema. No pallor.  Psychiatric: Her speech is normal and behavior is normal. Judgment and thought content normal. Her mood appears anxious. Cognition and memory are normal.          Assessment & Plan:   Problem List Items Addressed This Visit      Unprioritized   Hypertension - Primary    BP Readings from Last 3 Encounters:  11/14/14 149/72  10/10/14 152/78  07/25/14 166/81   BP elevated. Will increase Losartan to 50mg  daily. Recheck BP next week. Check renal function with labs today, given her recent use of her husband's lisinopril-HCTZ.      Relevant Medications   losartan (COZAAR) 50 MG tablet   Other Relevant Orders   Comprehensive metabolic panel   TSH   EKG 12-Lead (Completed)   Restless leg syndrome    Restless legs Recently worsening after stopping Requip. Will restart Requip at night. Discussed potential side effects of this medication.      Tremor  of both hands    Fine tremor of both hands. Likely worsened by anxiety. Will check thyroid function with labs. Discussed potential referral to neurology given symptoms of both restless legs and tremor. She declines for now.          Return in about 1 week (around 11/21/2014) for Recheck.

## 2014-11-14 NOTE — Assessment & Plan Note (Signed)
Restless legs Recently worsening after stopping Requip. Will restart Requip at night. Discussed potential side effects of this medication.

## 2014-11-15 LAB — COMPREHENSIVE METABOLIC PANEL
ALBUMIN: 4.6 g/dL (ref 3.5–5.2)
ALK PHOS: 74 U/L (ref 39–117)
ALT: 36 U/L — ABNORMAL HIGH (ref 0–35)
AST: 25 U/L (ref 0–37)
BUN: 21 mg/dL (ref 6–23)
CHLORIDE: 102 meq/L (ref 96–112)
CO2: 24 mEq/L (ref 19–32)
Calcium: 9.8 mg/dL (ref 8.4–10.5)
Creatinine, Ser: 0.81 mg/dL (ref 0.40–1.20)
GFR: 78.32 mL/min (ref >60.00)
GLUCOSE: 84 mg/dL (ref 70–99)
Potassium: 4.4 mEq/L (ref 3.5–5.1)
SODIUM: 138 meq/L (ref 135–145)
TOTAL PROTEIN: 7.5 g/dL (ref 6.0–8.3)
Total Bilirubin: 0.5 mg/dL (ref 0.2–1.2)

## 2014-11-15 LAB — TSH: TSH: 1.74 u[IU]/mL (ref 0.35–4.50)

## 2014-11-21 ENCOUNTER — Encounter: Payer: Self-pay | Admitting: Internal Medicine

## 2014-11-21 ENCOUNTER — Ambulatory Visit
Admission: RE | Admit: 2014-11-21 | Discharge: 2014-11-21 | Disposition: A | Discharge status: Home / Self Care | Payer: BLUE CROSS/BLUE SHIELD | Source: Ambulatory Visit | Attending: Internal Medicine | Admitting: Internal Medicine

## 2014-11-21 ENCOUNTER — Ambulatory Visit (INDEPENDENT_AMBULATORY_CARE_PROVIDER_SITE_OTHER): Payer: BLUE CROSS/BLUE SHIELD | Admitting: Internal Medicine

## 2014-11-21 VITALS — BP 163/88 | HR 112 | Temp 98.1°F | Ht 68.5 in | Wt 202.4 lb

## 2014-11-21 DIAGNOSIS — M7989 Other specified soft tissue disorders: Secondary | ICD-10-CM | POA: Diagnosis not present

## 2014-11-21 DIAGNOSIS — I10 Essential (primary) hypertension: Secondary | ICD-10-CM

## 2014-11-21 MED ORDER — METOPROLOL SUCCINATE ER 50 MG PO TB24
50.0000 mg | ORAL_TABLET | Freq: Every day | ORAL | Status: DC
Start: 2014-11-21 — End: 2014-11-28

## 2014-11-21 NOTE — Progress Notes (Signed)
Subjective:    Patient ID: Elizabeth Dunn, female    DOB: 22-Feb-1961, 54 y.o.   MRN: [id number]  HPI  53YO female presents for follow up.  HTN - Recently increased Losartan to  daily. Pt stopped medication because concerned about side effects. BP at home 180s/100.  Left arm swelling - Swells off and on for years. Previous evaluation was normal. Told she had lymphedema. No previous history of chest or arm trauma. Last week, had worsening swelling and redness in distal arm. Symptoms have gradually improved.  Past medical, surgical, family and social history per today's encounter.  Review of Systems  Constitutional: Positive for diaphoresis and fatigue. Negative for fever, chills, appetite change and unexpected weight change.  Eyes: Negative for visual disturbance.  Respiratory: Negative for shortness of breath.   Cardiovascular: Negative for chest pain, palpitations and leg swelling.  Gastrointestinal: Negative for abdominal pain.  Skin: Negative for color change and rash.  Hematological: Negative for adenopathy. Does not bruise/bleed easily.  Psychiatric/Behavioral: Negative for dysphoric mood. The patient is not nervous/anxious.        Objective:    BP 163/88 mmHg  Pulse 112  Temp(Src) 98.1 F (36.7 C) (Oral)  Ht 5' 8.5" (1.74 m)  Wt 202 lb 6 oz (91.797 kg)  BMI 30.32 kg/m2  SpO2 100% Physical Exam  Constitutional: She is oriented to person, place, and time. She appears well-developed and well-nourished. No distress.  HENT:  Head: Normocephalic and atraumatic.  Right Ear: External ear normal.  Left Ear: External ear normal.  Nose: Nose normal.  Mouth/Throat: Oropharynx is clear and moist. No oropharyngeal exudate.  Eyes: Conjunctivae are normal. Pupils are equal, round, and reactive to light. Right eye exhibits no discharge. Left eye exhibits no discharge. No scleral icterus.  Neck: Normal range of motion. Neck supple. No tracheal deviation present. No thyromegaly  present.  Cardiovascular: Normal rate, regular rhythm, normal heart sounds and intact distal pulses.  Exam reveals no gallop and no friction rub.   No murmur heard. Pulmonary/Chest: Effort normal and breath sounds normal. No respiratory distress. She has no wheezes. She has no rales. She exhibits no tenderness.  Musculoskeletal: Normal range of motion. She exhibits no edema or tenderness.       Arms:      Left hand: She exhibits swelling.  Lymphadenopathy:    She has no cervical adenopathy.  Neurological: She is alert and oriented to person, place, and time. No cranial nerve deficit. She exhibits normal muscle tone. Coordination normal.  Skin: Skin is warm and dry. No rash noted. She is not diaphoretic. No erythema. No pallor.  Psychiatric: She has a normal mood and affect. Her behavior is normal. Judgment and thought content normal.          Assessment & Plan:   Problem List Items Addressed This Visit      Unprioritized   Hypertension - Primary    BP Readings from Last 3 Encounters:  11/21/14 163/88  11/14/14 149/72  10/10/14 152/78   BP elevated. Pt non-compliant with medication. Discussed that Losartan may have not caused flu-like symptoms. However, she is reluctant to restart and feels that Atenolol has not been helpful, so will try changing Atenolol to Metoprolol. Discussed importance of BP control. Follow up recheck in 1 week.      Relevant Medications   metoprolol succinate (TOPROL-XL) 50 MG 24 hr tablet   Left arm swelling    Long h/o left arm swelling intermittently. Recent exacerbation  this weekend with persistent swelling. Question UE DVT. We also discussed possibility of lymphedema. Will check venous US. Set up evaluation with vascular. Follow up in 1 week.      Relevant Orders   Ambulatory referral to Vascular Surgery   US Venous Img Upper Uni Left       Return in about 1 week (around 11/28/2014) for Recheck.

## 2014-11-21 NOTE — Assessment & Plan Note (Signed)
Long h/o left arm swelling intermittently. Recent exacerbation this weekend with persistent swelling. Question UE DVT. We also discussed possibility of lymphedema. Will check venous US. Set up evaluation with vascular. Follow up in 1 week.

## 2014-11-21 NOTE — Progress Notes (Signed)
Pre visit review using our clinic review tool, if applicable. No additional management support is needed unless otherwise documented below in the visit note. 

## 2014-11-21 NOTE — Assessment & Plan Note (Signed)
BP Readings from Last 3 Encounters:  11/21/14 163/88  11/14/14 149/72  10/10/14 152/78   BP elevated. Pt non-compliant with medication. Discussed that Losartan may have not caused flu-like symptoms. However, she is reluctant to restart and feels that Atenolol has not been helpful, so will try changing Atenolol to Metoprolol. Discussed importance of BP control. Follow up recheck in 1 week.

## 2014-11-21 NOTE — Patient Instructions (Signed)
Stop Losartan.  Stop Atenolol.  Start Metoprolol  daily to control blood pressure.  Follow up 1 week.

## 2014-11-22 ENCOUNTER — Encounter: Payer: Self-pay | Admitting: Internal Medicine

## 2014-11-28 ENCOUNTER — Ambulatory Visit (INDEPENDENT_AMBULATORY_CARE_PROVIDER_SITE_OTHER): Payer: BLUE CROSS/BLUE SHIELD | Admitting: Internal Medicine

## 2014-11-28 ENCOUNTER — Encounter: Payer: Self-pay | Admitting: Internal Medicine

## 2014-11-28 VITALS — BP 155/74 | HR 83 | Temp 97.8°F | Ht 68.5 in | Wt 203.2 lb

## 2014-11-28 DIAGNOSIS — F419 Anxiety disorder, unspecified: Secondary | ICD-10-CM | POA: Diagnosis not present

## 2014-11-28 DIAGNOSIS — I10 Essential (primary) hypertension: Secondary | ICD-10-CM

## 2014-11-28 MED ORDER — ROPINIROLE HCL 1 MG PO TABS: 1.0000 mg | ORAL_TABLET | Freq: Every day | ORAL | Status: DC

## 2014-11-28 MED ORDER — BUPROPION HCL ER (XL) 300 MG PO TB24: 300.0000 mg | ORAL_TABLET | Freq: Every day | ORAL | Status: DC

## 2014-11-28 MED ORDER — METOPROLOL SUCCINATE ER 100 MG PO TB24: 100.0000 mg | ORAL_TABLET | Freq: Every day | ORAL | Status: DC

## 2014-11-28 NOTE — Patient Instructions (Signed)
Increase Metoprolol to  daily.  Follow up in 4 weeks.

## 2014-11-28 NOTE — Assessment & Plan Note (Signed)
BP Readings from Last 3 Encounters:  11/28/14 155/74  11/21/14 163/88  11/14/14 149/72   BP continues to be elevated. Increase Metoprolol to  daily.

## 2014-11-28 NOTE — Progress Notes (Signed)
   Subjective:    Patient ID: Elizabeth Dunn, female    DOB: [birth year], 54 y.o.   MRN: [id number]  HPI  54YO female presents for follow up.  HTN - Last visit changed Atenolol to Metoprolol. BP at home improved. Mostly 140-160s /80-90s at home. Anxiety seems to be improved and hand tremor improved.  BP Readings from Last 3 Encounters:  11/28/14 155/74  11/21/14 163/88  11/14/14 149/72     Past medical, surgical, family and social history per today's encounter.  Review of Systems  Constitutional: Negative for fever, chills, appetite change, fatigue and unexpected weight change.  Eyes: Negative for visual disturbance.  Respiratory: Negative for shortness of breath.   Cardiovascular: Negative for chest pain and leg swelling.  Gastrointestinal: Negative for abdominal pain, diarrhea and constipation.  Skin: Negative for color change and rash.  Neurological: Positive for tremors.  Hematological: Negative for adenopathy. Does not bruise/bleed easily.  Psychiatric/Behavioral: Negative for sleep disturbance and dysphoric mood. The patient is nervous/anxious.        Objective:    BP 155/74 mmHg  Pulse 83  Temp(Src) 97.8 F (36.6 C) (Oral)  Ht 5' 8.5" (1.74 m)  Wt 203 lb 4 oz (92.194 kg)  BMI 30.45 kg/m2  SpO2 95% Physical Exam  Constitutional: She is oriented to person, place, and time. She appears well-developed and well-nourished. No distress.  HENT:  Head: Normocephalic and atraumatic.  Right Ear: External ear normal.  Left Ear: External ear normal.  Nose: Nose normal.  Mouth/Throat: Oropharynx is clear and moist. No oropharyngeal exudate.  Eyes: Conjunctivae are normal. Pupils are equal, round, and reactive to light. Right eye exhibits no discharge. Left eye exhibits no discharge. No scleral icterus.  Neck: Normal range of motion. Neck supple. No tracheal deviation present. No thyromegaly present.  Cardiovascular: Normal rate, regular rhythm, normal heart sounds and  intact distal pulses.  Exam reveals no gallop and no friction rub.   No murmur heard. Pulmonary/Chest: Effort normal and breath sounds normal. No respiratory distress. She has no wheezes. She has no rales. She exhibits no tenderness.  Musculoskeletal: Normal range of motion. She exhibits no edema or tenderness.  Lymphadenopathy:    She has no cervical adenopathy.  Neurological: She is alert and oriented to person, place, and time. No cranial nerve deficit. She exhibits normal muscle tone. Coordination normal.  Skin: Skin is warm and dry. No rash noted. She is not diaphoretic. No erythema. No pallor.  Psychiatric: She has a normal mood and affect. Her behavior is normal. Judgment and thought content normal.          Assessment & Plan:   Problem List Items Addressed This Visit      Unprioritized   Anxiety   Relevant Medications   buPROPion (WELLBUTRIN XL) 300 MG 24 hr tablet   Hypertension - Primary    BP Readings from Last 3 Encounters:  11/28/14 155/74  11/21/14 163/88  11/14/14 149/72   BP continues to be elevated. Increase Metoprolol to  daily.      Relevant Medications   metoprolol succinate (TOPROL-XL) 100 MG 24 hr tableKarlton Lemonurn in about 4 weeks (around 12/26/2014) for Recheck.

## 2014-11-28 NOTE — Progress Notes (Signed)
Pre visit review using our clinic review tool, if applicable. No additional management support is needed unless otherwise documented below in the visit note. 

## 2015-01-03 ENCOUNTER — Encounter: Payer: Self-pay | Admitting: Internal Medicine

## 2015-01-09 ENCOUNTER — Ambulatory Visit: Payer: BLUE CROSS/BLUE SHIELD | Admitting: Internal Medicine

## 2015-01-22 ENCOUNTER — Other Ambulatory Visit: Payer: Self-pay

## 2015-01-22 MED ORDER — GABAPENTIN 100 MG PO CAPS: 100.0000 mg | ORAL_CAPSULE | Freq: Every day | ORAL | Status: DC

## 2015-03-07 ENCOUNTER — Telehealth: Payer: Self-pay | Admitting: *Deleted

## 2015-03-07 NOTE — Telephone Encounter (Signed)
See below

## 2015-03-07 NOTE — Telephone Encounter (Signed)
Patient has requested a written statement for her job, that she has had a physical, this year. Patient requested a call back, for questions.

## 2015-03-07 NOTE — Telephone Encounter (Signed)
Please call and scheduled this patient for a physical. Thanks

## 2015-03-07 NOTE — Telephone Encounter (Signed)
She has not had a physical this year.

## 2015-03-08 ENCOUNTER — Encounter: Payer: Self-pay | Admitting: Family Medicine

## 2015-03-08 ENCOUNTER — Ambulatory Visit (INDEPENDENT_AMBULATORY_CARE_PROVIDER_SITE_OTHER): Payer: BLUE CROSS/BLUE SHIELD | Admitting: Family Medicine

## 2015-03-08 VITALS — BP 136/78 | HR 78 | Temp 97.7°F | Ht 68.5 in | Wt 208.2 lb

## 2015-03-08 DIAGNOSIS — Z13220 Encounter for screening for lipoid disorders: Secondary | ICD-10-CM | POA: Diagnosis not present

## 2015-03-08 DIAGNOSIS — Z1231 Encounter for screening mammogram for malignant neoplasm of breast: Secondary | ICD-10-CM

## 2015-03-08 DIAGNOSIS — Z114 Encounter for screening for human immunodeficiency virus [HIV]: Secondary | ICD-10-CM | POA: Diagnosis not present

## 2015-03-08 DIAGNOSIS — Z1159 Encounter for screening for other viral diseases: Secondary | ICD-10-CM

## 2015-03-08 DIAGNOSIS — Z0000 Encounter for general adult medical examination without abnormal findings: Secondary | ICD-10-CM

## 2015-03-08 NOTE — Patient Instructions (Signed)
Nice to meet you. Please come back for lab work at your convenience.  Please attempt to exercise 30 minutes 3-4 days a week.  Please look at the diet instructions below and incorporate some changes.   Diet Recommendations  Starchy (carb) foods: Bread, rice, pasta, potatoes, corn, cereal, grits, crackers, bagels, muffins, all baked goods.  (Fruits, milk, and yogurt also have carbohydrate, but most of these foods will not spike your blood sugar as the starchy foods will.)  A few fruits do cause high blood sugars; use small portions of bananas (limit to 1/2 at a time), grapes, watermelon, oranges, and most tropical fruits.    Protein foods: Meat, fish, poultry, eggs, dairy foods, and beans such as pinto and kidney beans (beans also provide carbohydrate).   1. Eat at least 3 meals and 1-2 snacks per day. Never go more than 4-5 hours while awake without eating. Eat breakfast within the first hour of getting up.   2. Limit starchy foods to TWO per meal and ONE per snack. ONE portion of a starchy  food is equal to the following:   - ONE slice of bread (or its equivalent, such as half of a hamburger bun).   - 1/2 cup of a "scoopable" starchy food such as potatoes or rice.   - 15 grams of carbohydrate as shown on food label.  3. Include at every meal: a protein food, a carb food, and vegetables and/or fruit.   - Obtain twice the volume of veg's as protein or carbohydrate foods for both lunch and dinner.   - Fresh or frozen veg's are best.   - Keep frozen veg's on hand for a quick vegetable serving.

## 2015-03-08 NOTE — Progress Notes (Signed)
Pre visit review using our clinic review tool, if applicable. No additional management support is needed unless otherwise documented below in the visit note. 

## 2015-03-09 ENCOUNTER — Telehealth: Payer: Self-pay | Admitting: Family Medicine

## 2015-03-09 DIAGNOSIS — Z0000 Encounter for general adult medical examination without abnormal findings: Secondary | ICD-10-CM | POA: Insufficient documentation

## 2015-03-09 LAB — COMPREHENSIVE METABOLIC PANEL
ALT: 28 U/L (ref 0–35)
AST: 22 U/L (ref 0–37)
Albumin: 4.5 g/dL (ref 3.5–5.2)
Alkaline Phosphatase: 78 U/L (ref 39–117)
BILIRUBIN TOTAL: 0.6 mg/dL (ref 0.2–1.2)
BUN: 13 mg/dL (ref 6–23)
CALCIUM: 9.7 mg/dL (ref 8.4–10.5)
CO2: 29 mEq/L (ref 19–32)
CREATININE: 0.72 mg/dL (ref 0.40–1.20)
Chloride: 101 mEq/L (ref 96–112)
GFR: 89.62 mL/min (ref >60.00)
Glucose, Bld: 95 mg/dL (ref 70–99)
Potassium: 5 mEq/L (ref 3.5–5.1)
Sodium: 138 mEq/L (ref 135–145)
Total Protein: 7.2 g/dL (ref 6.0–8.3)

## 2015-03-09 LAB — LIPID PANEL
CHOLESTEROL: 238 mg/dL — AB (ref 0–200)
HDL: 54.5 mg/dL (ref >39.00)
LDL CALC: 160 mg/dL — AB (ref 0–99)
NonHDL: 183.34
Total CHOL/HDL Ratio: 4
Triglycerides: 116 mg/dL (ref 0.0–149.0)
VLDL: 23.2 mg/dL (ref 0.0–40.0)

## 2015-03-09 NOTE — Telephone Encounter (Signed)
Diet mailed.

## 2015-03-09 NOTE — Progress Notes (Signed)
Patient ID: Elizabeth Dunn, female   DOB: 03-25-1961, 54 y.o.   MRN: [id number]  [name], MD Phone: [phone]  Elizabeth Dunn is a 54 y.o. female who presents today for yearly physical  Patient reports no complaints today. She notes she needs a physical for work. Patient reports she has a history of hysterectomy for noncancerous issues. She states this is due to the lining of her cervix coming out. Reports last Pap smear was 2 years ago. She's never had a colonoscopy. She possibly has had stool cards in the past. She's not ever had HIV checked. Has not had hepatitis C check. Last mammogram was 3-4 years ago. She does not smoke, drink alcohol, or use drugs. She notes she is allergic to the flu shot. She notes she quit eating meat other than fish sometime last year. She's cut back on breads. She is eating vegetables for lunch and dinner. She typically does not eat breakfast. Patient does not exercise. 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 She also does work in the yard.  Active Ambulatory Problems    Diagnosis Date Noted  . Hypertension 01/01/2011  . Anxiety 01/08/2011  . Panic disorder 07/10/2011  . Arthritis 07/10/2011  . Menopausal disorder 03/04/2012  . Restless leg syndrome 12/12/2013  . Obesity (BMI 30-39.9) 12/12/2013  . Tremor of both hands 11/14/2014  . Left arm swelling 11/21/2014  . Annual physical exam 03/09/2015   Resolved Ambulatory Problems    Diagnosis Date Noted  . Palpitations 01/01/2011  . Sinusitis, acute maxillary 08/01/2011  . Maxillary sinusitis, chronic 08/28/2011  . Skin lesion of right arm 10/17/2011  . Medication adverse effect 03/13/2014  . Skin lesion 07/25/2014   No Additional Past Medical History    Family History  Problem Relation Age of Onset  . Hypertension Mother   . Heart disease Father   . Hypertension Father   . Heart disease Paternal Grandmother   . Heart  disease Paternal Grandfather     Social History   Social History  . Marital Status: Married    Spouse Name: N/A  . Number of Children: N/A  . Years of Education: N/A   Occupational History  . Not on file.   Social History Main Topics  . Smoking status: Never Smoker   . Smokeless tobacco: Never Used  . Alcohol Use: Yes     Comment: 1 glass of wine  . Drug Use: No  . Sexual Activity: Not on file   Other Topics Concern  . Not on file   Social History Narrative   Lives in [place]. Worked as Control and instrumentation engineer.  Married with 27Yo and 21YO children.    ROS   General:  Negative for unexplained weight loss, fever Skin: Negative for new or changing mole, sore that won't heal HEENT: Negative for trouble hearing, trouble seeing, ringing in ears, mouth sores, hoarseness, change in voice, dysphagia. CV:  Negative for chest pain, dyspnea, edema, palpitations Resp: Negative for cough, dyspnea, hemoptysis GI: Negative for nausea, vomiting, diarrhea, constipation, abdominal pain, melena, hematochezia. GU: Negative for dysuria, incontinence, urinary hesitance, hematuria, vaginal or penile discharge, polyuria, sexual difficulty, lumps in testicle or breasts MSK: Negative for muscle cramps or aches, joint pain or swelling Neuro: Negative for headaches, weakness, numbness, dizziness, passing out/fainting Psych: Negative for depression, anxiety, memory problems  Objective  Physical Exam Filed Vitals:   03/08/15 1403  BP: 136/78  Pulse: 78  Temp: 97.7 F (36.5 C)    Physical Exam  Constitutional: She is well-developed, well-nourished, and in no distress.  HENT:  Head: Normocephalic and atraumatic.  Right Ear: External ear normal.  Left Ear: External ear normal.  Mouth/Throat: Oropharynx is clear and moist. No oropharyngeal exudate.  Eyes: Conjunctivae are normal. Pupils are equal, round, and reactive to light.  Neck: Neck supple.  Cardiovascular: Normal rate, regular rhythm  and normal heart sounds.  Exam reveals no gallop and no friction rub.   No murmur heard. Pulmonary/Chest: Effort normal and breath sounds normal. No respiratory distress. She has no wheezes. She has no rales.  Abdominal: Soft. Bowel sounds are normal. She exhibits no distension. There is no tenderness. There is no rebound and no guarding.  Genitourinary:  Declined pelvic exam  Musculoskeletal: She exhibits no edema.  Lymphadenopathy:    She has no cervical adenopathy.  Neurological: She is alert. Gait normal.  Skin: Skin is warm and dry. She is not diaphoretic.  Psychiatric: Mood and affect normal.     Assessment/Plan:   Annual physical exam Patient doing well overall. She is obese. We discussed diet and exercise at length and she is given dietary information. Patient with possible allergic reaction to flu shot in the past so we'll not give this today. We did discuss pelvic exam, though she declined this today. Advised that she should have this done at her next yearly physical. We'll give her cologuard cards for colon cancer screening. We will check an HIV and hepatitis C today. We'll also check screening lab work. Mammogram ordered as well.    Orders Placed This Encounter  Procedures  . MM Digital Screening    Standing Status: Future     Number of Occurrences:      Standing Expiration Date: 05/07/2016    Order Specific Question:  Reason for Exam (SYMPTOM  OR DIAGNOSIS REQUIRED)    Answer:  screening mammogram    Order Specific Question:  Is the patient pregnant?    Answer:  No    Order Specific Question:  Preferred imaging location?    Answer:  [facility]  . Lipid Profile    Standing Status: Future     Number of Occurrences: 1     Standing Expiration Date: 03/07/2016  . Comp Met (CMET)    Standing Status: Future     Number of Occurrences: 1     Standing Expiration Date: 03/07/2016  . HIV antibody (with reflex)    Standing Status: Future     Number of Occurrences: 1       Standing Expiration Date: 03/07/2016  . Hepatitis C Antibody    Standing Status: Future     Number of Occurrences: 1     Standing Expiration Date: 03/07/2016   [name]

## 2015-03-09 NOTE — Telephone Encounter (Signed)
Called and spoke with patient regarding her lab work. Her cholesterol is elevated. Her ASCVD risk score is 3.4% placing her in a not statin benefit group. Her LDL is 160. I discussed options of diet and exercise and medication with the patient and she would like to try diet and exercise. She will need to follow-up with her PCP to discuss this further. We will mail her cholesterol diet information. She was advised of her other normal lab results.

## 2015-03-09 NOTE — Assessment & Plan Note (Addendum)
Patient doing well overall. She is obese. We discussed diet and exercise at length and she is given dietary information. Patient with possible allergic reaction to flu shot in the past so we'll not give this today. We did discuss pelvic exam, though she declined this today. Advised that she should have this done at her next yearly physical. We'll give her cologuard cards for colon cancer screening. We will check an HIV and hepatitis C today. We'll also check screening lab work. Mammogram ordered as well.

## 2015-03-10 LAB — HIV ANTIBODY (ROUTINE TESTING W REFLEX): HIV: NONREACTIVE

## 2015-03-10 LAB — HEPATITIS C ANTIBODY: HCV AB: NEGATIVE

## 2015-03-29 ENCOUNTER — Encounter: Payer: Self-pay | Admitting: Internal Medicine

## 2015-03-29 ENCOUNTER — Telehealth: Payer: Self-pay | Admitting: Internal Medicine

## 2015-03-29 NOTE — Telephone Encounter (Signed)
Pt called about needing a refill for clonazePAM (KLONOPIN) 0.5 MG tablet. Pharmacy is [facility] [place] - [address] Thank You!

## 2015-03-29 NOTE — Telephone Encounter (Signed)
Please advise on refill.

## 2015-03-29 NOTE — Telephone Encounter (Signed)
Fine to refill 

## 2015-03-29 NOTE — Telephone Encounter (Signed)
RX in Dr. Cathren HarshWalkers Blue folder to sign

## 2015-03-30 ENCOUNTER — Other Ambulatory Visit: Payer: Self-pay | Admitting: *Deleted

## 2015-03-30 DIAGNOSIS — F419 Anxiety disorder, unspecified: Secondary | ICD-10-CM

## 2015-03-30 MED ORDER — CLONAZEPAM 0.5 MG PO TABS: ORAL_TABLET | ORAL | Status: DC

## 2015-04-17 ENCOUNTER — Encounter: Payer: Self-pay | Admitting: Internal Medicine

## 2015-06-19 ENCOUNTER — Telehealth: Payer: Self-pay | Admitting: Internal Medicine

## 2015-06-19 NOTE — Telephone Encounter (Signed)
Please advise, thanks.

## 2015-06-19 NOTE — Telephone Encounter (Signed)
Patient Name: Elizabeth Dunn DOB: 10/31/1960 Initial Comment Caller states blood pressure has been going crazy, takes Metroprolol. 174/92 this morning, heart racing and shaky Nurse Assessment Nurse: Elijah Birk, RN, Stark Bray Date/Time (Eastern Time): 06/19/2015 11:07:00 AM Confirm and document reason for call. If symptomatic, describe symptoms. You must click the next button to save text entered. ---Caller states her blood pressure has been going crazy, takes Metoprolol. 174/92 this morning, heart racing and shaky. Last week, had some lightheadness, and her BP was somewhat elevated. Had to call out of work last week a couple days d/t fatigue. 147/88 has been the lowest reading. Has the patient traveled out of the country within the last 30 days? ---Not Applicable Does the patient have any new or worsening symptoms? ---Yes Will a triage be completed? ---Yes Related visit to physician within the last 2 weeks? ---No Does the PT have any chronic conditions? (i.e. diabetes, asthma, etc.) ---Yes List chronic conditions. ---on BP rx Is the patient pregnant or possibly pregnant? (Ask all females between the ages of 62-55) ---No Is this a behavioral health or substance abuse call? ---No Guidelines Guideline Title Affirmed Question Affirmed Notes High Blood Pressure [1] BP # 140/90 AND [2] taking BP medications Final Disposition User See PCP within 2 Filbert Berthold, RN, Stark Bray Comments Please contact caller at this # with further advice. Scheduled for next week with her provider. She uses Nurse, adult. [phone]. NKDA. Takes Metoprolol 100 mg once a day. Referrals REFERRED TO PCP OFFICE Disagree/Comply: Comply

## 2015-06-19 NOTE — Telephone Encounter (Signed)
Work in today if possible with another provider, or later this week with me

## 2015-06-20 ENCOUNTER — Encounter: Payer: Self-pay | Admitting: Internal Medicine

## 2015-06-20 ENCOUNTER — Ambulatory Visit (INDEPENDENT_AMBULATORY_CARE_PROVIDER_SITE_OTHER): Payer: BLUE CROSS/BLUE SHIELD | Admitting: Internal Medicine

## 2015-06-20 ENCOUNTER — Ambulatory Visit: Payer: BLUE CROSS/BLUE SHIELD | Admitting: Internal Medicine

## 2015-06-20 VITALS — BP 146/79 | HR 81 | Temp 97.9°F | Ht 68.5 in | Wt 210.0 lb

## 2015-06-20 DIAGNOSIS — F410 Panic disorder [episodic paroxysmal anxiety] without agoraphobia: Secondary | ICD-10-CM

## 2015-06-20 DIAGNOSIS — I10 Essential (primary) hypertension: Secondary | ICD-10-CM

## 2015-06-20 LAB — CBC WITH DIFFERENTIAL/PLATELET
BASOS ABS: 0 10*3/uL (ref 0.0–0.1)
Basophils Relative: 0.5 % (ref 0.0–3.0)
EOS ABS: 0.3 10*3/uL (ref 0.0–0.7)
Eosinophils Relative: 3.8 % (ref 0.0–5.0)
HEMATOCRIT: 40.6 % (ref 36.0–46.0)
HEMOGLOBIN: 13.8 g/dL (ref 12.0–15.0)
LYMPHS PCT: 21 % (ref 12.0–46.0)
Lymphs Abs: 1.7 10*3/uL (ref 0.7–4.0)
MCHC: 33.9 g/dL (ref 30.0–36.0)
MCV: 93.3 fl (ref 78.0–100.0)
Monocytes Absolute: 0.6 10*3/uL (ref 0.1–1.0)
Monocytes Relative: 6.8 % (ref 3.0–12.0)
Neutro Abs: 5.6 10*3/uL (ref 1.4–7.7)
Neutrophils Relative %: 67.9 % (ref 43.0–77.0)
Platelets: 365 10*3/uL (ref 150.0–400.0)
RBC: 4.35 Mil/uL (ref 3.87–5.11)
RDW: 12.5 % (ref 11.5–15.5)
WBC: 8.2 10*3/uL (ref 4.0–10.5)

## 2015-06-20 LAB — COMPREHENSIVE METABOLIC PANEL
ALBUMIN: 4.6 g/dL (ref 3.5–5.2)
ALT: 35 U/L (ref 0–35)
AST: 23 U/L (ref 0–37)
Alkaline Phosphatase: 76 U/L (ref 39–117)
BILIRUBIN TOTAL: 0.4 mg/dL (ref 0.2–1.2)
BUN: 15 mg/dL (ref 6–23)
CALCIUM: 9.6 mg/dL (ref 8.4–10.5)
CO2: 28 mEq/L (ref 19–32)
CREATININE: 0.67 mg/dL (ref 0.40–1.20)
Chloride: 105 mEq/L (ref 96–112)
GFR: 97.28 mL/min (ref >60.00)
Glucose, Bld: 116 mg/dL — ABNORMAL HIGH (ref 70–99)
Potassium: 5.1 mEq/L (ref 3.5–5.1)
SODIUM: 139 meq/L (ref 135–145)
TOTAL PROTEIN: 7.1 g/dL (ref 6.0–8.3)

## 2015-06-20 LAB — TSH: TSH: 1.93 u[IU]/mL (ref 0.35–4.50)

## 2015-06-20 MED ORDER — HYDRALAZINE HCL 10 MG PO TABS: 10.0000 mg | ORAL_TABLET | Freq: Three times a day (TID) | ORAL | Status: DC

## 2015-06-20 NOTE — Telephone Encounter (Signed)
Pt called stating that her bp was 208/96. Please advise pt.. @ [phone].Elizabeth Dunn Pt would like to be seen today if possible.Elizabeth Dunn

## 2015-06-20 NOTE — Telephone Encounter (Signed)
We can see her at 9:30am, or she can see another provider.

## 2015-06-20 NOTE — Assessment & Plan Note (Signed)
Recent worsening anxiety. Encouraged her to use Clonazepam as needed.

## 2015-06-20 NOTE — Progress Notes (Signed)
Subjective:    Patient ID: Elizabeth Dunn, female    DOB: June 27, 1960, 55 y.o.   MRN: [id number]  HPI  55YO female presents for acute visit.  BP at home was elevated with SBPs in the 200s. Feels anxious and jittery. Started last Monday. BP 187/90. Wednesday felt fatigued. Missed work.  Thursday also felt poorly. No fever. No NV. Took Clonazepam 2 days ago. Yesterday morning, BP was elevated again.  Wt Readings from Last 3 Encounters:  06/20/15 210 lb (95.255 kg)  03/08/15 208 lb 3.2 oz (94.439 kg)  11/28/14 203 lb 4 oz (92.194 kg)   BP Readings from Last 3 Encounters:  06/20/15 146/79  03/08/15 136/78  11/28/14 155/74    Past Medical History  Diagnosis Date  . Hypertension    Family History  Problem Relation Age of Onset  . Hypertension Mother   . Heart disease Father   . Hypertension Father   . Heart disease Paternal Grandmother   . Heart disease Paternal Grandfather    Past Surgical History  Procedure Laterality Date  . Vaginal hysterectomy  2007    Dr. Barnabas Lister  . Cholecystectomy  2012    Dr. Lemar Livings  . Right oophorectomy  2012    Dr. Barnabas Lister   Social History   Social History  . Marital Status: Married    Spouse Name: N/A  . Number of Children: N/A  . Years of Education: N/A   Social History Main Topics  . Smoking status: Never Smoker   . Smokeless tobacco: Never Used  . Alcohol Use: Yes     Comment: 1 glass of wine  . Drug Use: No  . Sexual Activity: Not Asked   Other Topics Concern  . None   Social History Narrative   Lives in [place]. 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    Review of Systems  Constitutional: Positive for fatigue. Negative for fever, chills, appetite change and unexpected weight change.  Eyes: Negative for visual disturbance.  Respiratory: Negative for shortness of breath.   Cardiovascular: Negative for chest pain, palpitations and leg swelling.  Gastrointestinal: Negative for  abdominal pain.  Skin: Negative for color change and rash.  Neurological: Negative for headaches.  Hematological: Negative for adenopathy. Does not bruise/bleed easily.  Psychiatric/Behavioral: Positive for behavioral problems and agitation. Negative for sleep disturbance and dysphoric mood. The patient is nervous/anxious.        Objective:    BP 146/79 mmHg  Pulse 81  Temp(Src) 97.9 F (36.6 C) (Oral)  Ht 5' 8.5" (1.74 m)  Wt 210 lb (95.255 kg)  BMI 31.46 kg/m2  SpO2 95% Physical Exam  Constitutional: She is oriented to person, place, and time. She appears well-developed and well-nourished. No distress.  HENT:  Head: Normocephalic and atraumatic.  Right Ear: External ear normal.  Left Ear: External ear normal.  Nose: Nose normal.  Mouth/Throat: Oropharynx is clear and moist. No oropharyngeal exudate.  Eyes: Conjunctivae are normal. Pupils are equal, round, and reactive to light. Right eye exhibits no discharge. Left eye exhibits no discharge. No scleral icterus.  Neck: Normal range of motion. Neck supple. No tracheal deviation present. No thyromegaly present.  Cardiovascular: Normal rate, regular rhythm, normal heart sounds and intact distal pulses.  Exam reveals no gallop and no friction rub.   No murmur heard. Pulmonary/Chest: Effort normal and breath sounds normal. No respiratory distress. She has no wheezes. She has no rales. She exhibits no tenderness.  Musculoskeletal:  Normal range of motion. She exhibits no edema or tenderness.  Lymphadenopathy:    She has no cervical adenopathy.  Neurological: She is alert and oriented to person, place, and time. No cranial nerve deficit. She exhibits normal muscle tone. Coordination normal.  Skin: Skin is warm and dry. No rash noted. She is not diaphoretic. No erythema. No pallor.  Psychiatric: Her speech is normal and behavior is normal. Judgment and thought content normal. Her mood appears anxious.          Assessment & Plan:    Problem List Items Addressed This Visit      Unprioritized   Hypertension - Primary    BP Readings from Last 3 Encounters:  06/20/15 146/79  03/08/15 136/78  11/28/14 155/74   BP improved here in office compared to home readings. Will add prn Hydralazine. Will check renal function, thyroid function with labs. Follow up 1 week recheck.      Relevant Medications   hydrALAZINE (APRESOLINE) 10 MG tablet   Other Relevant Orders   TSH   Comprehensive metabolic panel   CBC with Differential/Platelet   EKG 12-Lead (Completed)   Panic disorder    Recent worsening anxiety. Encouraged her to use Clonazepam as needed.          Return in about 1 week (around 06/27/2015) for Recheck of Blood Pressure.  Ronna Polio, MD Internal Medicine [facility]

## 2015-06-20 NOTE — Patient Instructions (Signed)
Labs today to help determine cause of elevated blood pressure.  Add Hydralazine  up to three times daily as needed for blood pressure over 160/100.  Monitor blood pressure daily.  Follow up in 1 week.

## 2015-06-20 NOTE — Telephone Encounter (Signed)
Please advise, thanks.

## 2015-06-20 NOTE — Progress Notes (Signed)
Pre visit review using our clinic review tool, if applicable. No additional management support is needed unless otherwise documented below in the visit note. 

## 2015-06-20 NOTE — Assessment & Plan Note (Signed)
BP Readings from Last 3 Encounters:  06/20/15 146/79  03/08/15 136/78  11/28/14 155/74   BP improved here in office compared to home readings. Will add prn Hydralazine. Will check renal function, thyroid function with labs. Follow up 1 week recheck.

## 2015-06-26 ENCOUNTER — Ambulatory Visit: Payer: Self-pay | Admitting: Internal Medicine

## 2015-06-27 ENCOUNTER — Encounter: Payer: Self-pay | Admitting: Internal Medicine

## 2015-06-27 ENCOUNTER — Ambulatory Visit (INDEPENDENT_AMBULATORY_CARE_PROVIDER_SITE_OTHER): Payer: BLUE CROSS/BLUE SHIELD | Admitting: Internal Medicine

## 2015-06-27 VITALS — BP 156/96 | HR 86 | Temp 98.2°F | Ht 69.0 in | Wt 210.5 lb

## 2015-06-27 DIAGNOSIS — I10 Essential (primary) hypertension: Secondary | ICD-10-CM

## 2015-06-27 DIAGNOSIS — F419 Anxiety disorder, unspecified: Secondary | ICD-10-CM | POA: Diagnosis not present

## 2015-06-27 MED ORDER — FUROSEMIDE 20 MG PO TABS: 20.0000 mg | ORAL_TABLET | Freq: Every day | ORAL | Status: DC

## 2015-06-27 MED ORDER — BUPROPION HCL ER (XL) 150 MG PO TB24: 150.0000 mg | ORAL_TABLET | Freq: Every day | ORAL | Status: DC

## 2015-06-27 MED ORDER — CLONAZEPAM 1 MG PO TABS: 1.0000 mg | ORAL_TABLET | Freq: Three times a day (TID) | ORAL | Status: DC | PRN

## 2015-06-27 NOTE — Progress Notes (Signed)
Pre visit review using our clinic review tool, if applicable. No additional management support is needed unless otherwise documented below in the visit note. 

## 2015-06-27 NOTE — Patient Instructions (Addendum)
Add Furosemide  in the morning.  Try to limit Gabapentin to  at bedtime.  Increase Clonazepam to  three times daily as needed for anxiety.  Decrease Wellbutrin to  daily.  Follow up in 1-2 weeks.

## 2015-06-27 NOTE — Assessment & Plan Note (Signed)
BP Readings from Last 3 Encounters:  06/27/15 156/96  06/20/15 146/79  03/08/15 136/78   BP elevated. Never started Hydralazine. Med choices limited by multiple allergies/sensitivities. Will try adding low dose of lasix given recent increased fluid retention on Gabapentin. Discussed potential risks of Lasix. Recheck BMP next week.

## 2015-06-27 NOTE — Progress Notes (Signed)
Subjective:    Patient ID: Elizabeth Dunn, female    DOB: Apr 11, 1961, 54 y.o.   MRN: [id number]  HPI  55YO female presents for follow up.  Last seen 2/22 for hypertension. Added Hydralazine. Does not check BP at home. BP monitor at home not working. Not taking Hydralazine. Feeling well.  Planning a trip to the beach next week.  Notes some increased anxiety. Taking wellbutrin and prn Clonazepam. Questions if higher dose of Clonazepam might be helpful.   Questions if she might be retaining fluid after starting on the Gabapentin.    Wt Readings from Last 3 Encounters:  06/27/15 210 lb 8 oz (95.482 kg)  06/20/15 210 lb (95.255 kg)  03/08/15 208 lb 3.2 oz (94.439 kg)   BP Readings from Last 3 Encounters:  06/27/15 156/96  06/20/15 146/79  03/08/15 136/78    Past Medical History  Diagnosis Date  . Hypertension    Family History  Problem Relation Age of Onset  . Hypertension Mother   . Heart disease Father   . Hypertension Father   . Heart disease Paternal Grandmother   . Heart disease Paternal Grandfather    Past Surgical History  Procedure Laterality Date  . Vaginal hysterectomy  2007    Dr. Barnabas Lister  . Cholecystectomy  2012    Dr. Lemar Livings  . Right oophorectomy  2012    Dr. Barnabas Lister   Social History   Social History  . Marital Status: Married    Spouse Name: N/A  . Number of Children: N/A  . Years of Education: N/A   Social History Main Topics  . Smoking status: Never Smoker   . Smokeless tobacco: Never Used  . Alcohol Use: Yes     Comment: 1 glass of wine  . Drug Use: No  . Sexual Activity: Not Asked   Other Topics Concern  . None   Social History Narrative   Lives in [place]. 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    Review of Systems  Constitutional: Negative for fever, chills, appetite change, fatigue and unexpected weight change.  Eyes: Negative for visual disturbance.  Respiratory: Negative for cough  and shortness of breath.   Cardiovascular: Negative for chest pain, palpitations and leg swelling.  Gastrointestinal: Negative for abdominal pain.  Skin: Negative for color change and rash.  Hematological: Negative for adenopathy. Does not bruise/bleed easily.  Psychiatric/Behavioral: Negative for suicidal ideas, sleep disturbance and dysphoric mood. The patient is nervous/anxious.        Objective:    BP 156/96 mmHg  Pulse 86  Temp(Src) 98.2 F (36.8 C) (Oral)  Ht  (1.753 m)  Wt 210 lb 8 oz (95.482 kg)  BMI 31.07 kg/m2  SpO2 96% Physical Exam  Constitutional: She is oriented to person, place, and time. She appears well-developed and well-nourished. No distress.  HENT:  Head: Normocephalic and atraumatic.  Right Ear: External ear normal.  Left Ear: External ear normal.  Nose: Nose normal.  Mouth/Throat: Oropharynx is clear and moist. No oropharyngeal exudate.  Eyes: Conjunctivae are normal. Pupils are equal, round, and reactive to light. Right eye exhibits no discharge. Left eye exhibits no discharge. No scleral icterus.  Neck: Normal range of motion. Neck supple. No tracheal deviation present. No thyromegaly present.  Cardiovascular: Normal rate, regular rhythm, normal heart sounds and intact distal pulses.  Exam reveals no gallop and no friction rub.   No murmur heard. Pulmonary/Chest: Effort normal and breath sounds normal.  No respiratory distress. She has no wheezes. She has no rales. She exhibits no tenderness.  Musculoskeletal: Normal range of motion. She exhibits no edema or tenderness.  Lymphadenopathy:    She has no cervical adenopathy.  Neurological: She is alert and oriented to person, place, and time. No cranial nerve deficit. She exhibits normal muscle tone. Coordination normal.  Skin: Skin is warm and dry. No rash noted. She is not diaphoretic. No erythema. No pallor.  Psychiatric: Her speech is normal and behavior is normal. Judgment and thought content  normal. Her mood appears anxious.          Assessment & Plan:   Problem List Items Addressed This Visit      Unprioritized   Anxiety    Severe anxiety with panic. Question if Wellbutrin may be making this worse. Will taper to  daily. Increase Clonazepam to  po tid prn. Follow up in 1-2 weeks.      Relevant Medications   buPROPion (WELLBUTRIN XL) 150 MG 24 hr tablet   Hypertension - Primary    BP Readings from Last 3 Encounters:  06/27/15 156/96  06/20/15 146/79  03/08/15 136/78   BP elevated. Never started Hydralazine. Med choices limited by multiple allergies/sensitivities. Will try adding low dose of lasix given recent increased fluid retention on Gabapentin. Discussed potential risks of Lasix. Recheck BMP next week.      Relevant Medications   furosemide (LASIX) 20 MG tablet       Return in about 1 week (around 07/04/2015) for Recheck.  Ronna Polio, MD Internal Medicine [facility]

## 2015-06-27 NOTE — Assessment & Plan Note (Signed)
Severe anxiety with panic. Question if Wellbutrin may be making this worse. Will taper to  daily. Increase Clonazepam to  po tid prn. Follow up in 1-2 weeks.

## 2015-08-22 ENCOUNTER — Other Ambulatory Visit: Payer: Self-pay

## 2015-08-22 DIAGNOSIS — I10 Essential (primary) hypertension: Secondary | ICD-10-CM

## 2015-08-22 MED ORDER — METOPROLOL SUCCINATE ER 100 MG PO TB24: 100.0000 mg | ORAL_TABLET | Freq: Every day | ORAL | Status: DC

## 2015-09-14 ENCOUNTER — Telehealth: Payer: Self-pay | Admitting: *Deleted

## 2015-09-14 MED ORDER — GABAPENTIN 100 MG PO CAPS: 100.0000 mg | ORAL_CAPSULE | Freq: Every day | ORAL | Status: DC

## 2015-09-14 NOTE — Telephone Encounter (Signed)
Patient has requested a medication refill for gabapentin  [facility]

## 2015-09-14 NOTE — Telephone Encounter (Signed)
Refilled

## 2015-09-25 ENCOUNTER — Other Ambulatory Visit: Payer: Self-pay | Admitting: *Deleted

## 2015-09-25 ENCOUNTER — Telehealth: Payer: Self-pay | Admitting: Internal Medicine

## 2015-09-25 DIAGNOSIS — I10 Essential (primary) hypertension: Secondary | ICD-10-CM

## 2015-09-25 MED ORDER — METOPROLOL SUCCINATE ER 100 MG PO TB24: 100.0000 mg | ORAL_TABLET | Freq: Every day | ORAL | Status: DC

## 2015-09-25 MED ORDER — ROPINIROLE HCL 1 MG PO TABS: 1.0000 mg | ORAL_TABLET | Freq: Every day | ORAL | Status: DC

## 2015-09-25 NOTE — Telephone Encounter (Signed)
Pt would like to have the following refills; metoprolol succinate (TOPROL-XL) 100 MG 24 hr tablet and rOPINIRole (REQUIP) 1 MG tablet. [facility]

## 2015-09-25 NOTE — Telephone Encounter (Signed)
Sent in refills for Metoprolol XL 100mg  24 hr tablet #90 and Requip 1mg . #[facility] to [facility]. Requested patient to make an appointment before any more refills.

## 2015-10-24 ENCOUNTER — Other Ambulatory Visit: Payer: Self-pay

## 2015-10-24 NOTE — Telephone Encounter (Signed)
Pt requesting refill on Prozac. Reviewed latest two office visits and Prozac was not mentioned either note. Just making sure it's okay to refill medicine. Last sent in 09/2014. Pt was only given 6 months in 09/2014; makes me question if she is taking medicine regular. Please correct me if I am wrong.

## 2015-10-26 ENCOUNTER — Other Ambulatory Visit: Payer: Self-pay | Admitting: *Deleted

## 2015-10-26 DIAGNOSIS — Z760 Encounter for issue of repeat prescription: Secondary | ICD-10-CM

## 2015-10-26 NOTE — Telephone Encounter (Signed)
Last OV 06/2015 Last ordered 10/10/14, #30 with 5 refills According to last order date he would not have taken any since December 2016 Medication pended for your approval/denial

## 2015-10-26 NOTE — Telephone Encounter (Signed)
Patient requested a medication refill for Prozac [facility]

## 2015-10-29 ENCOUNTER — Telehealth: Payer: Self-pay

## 2015-10-29 MED ORDER — ROPINIROLE HCL 1 MG PO TABS: 1.0000 mg | ORAL_TABLET | Freq: Every day | ORAL | Status: DC

## 2015-10-29 MED ORDER — FLUOXETINE HCL 20 MG PO CAPS: 20.0000 mg | ORAL_CAPSULE | Freq: Every day | ORAL | Status: DC

## 2015-10-29 NOTE — Telephone Encounter (Signed)
Refill medication

## 2015-10-29 NOTE — Telephone Encounter (Signed)
Needs to be seen.  May have to be with new provider, as schedule is full.

## 2015-10-29 NOTE — Telephone Encounter (Signed)
This has been sent to me multiple times. She hasn't been taking this. Do not refill

## 2015-10-29 NOTE — Telephone Encounter (Signed)
Okay to refill? 

## 2015-10-29 NOTE — Telephone Encounter (Addendum)
Patient stated that she has to stay on the medication because the Wellbutrin was not working. She stated the Wellbutrin caused weight gain.  Pt requested a medication refill for ropinirole  [facility] Pt contact [phone]

## 2015-10-29 NOTE — Addendum Note (Signed)
Addended by: Acey LavOMAN, TANYA M on: 10/29/2015 04:06 PM   Modules accepted: Orders

## 2015-10-29 NOTE — Telephone Encounter (Signed)
Refilled till appt. thanks 

## 2015-10-29 NOTE — Telephone Encounter (Signed)
Please schedule patient an appt, if not available with Dan HumphreysWalker then with a new provider.  thanks

## 2015-10-29 NOTE — Telephone Encounter (Signed)
Please advise 

## 2015-10-29 NOTE — Telephone Encounter (Signed)
I called pt back and advised her that she needs to sch a appt before she can get a refill. Pt is scheduled with Dr Adriana Simasook in July.

## 2015-10-29 NOTE — Telephone Encounter (Signed)
Patient has a follow up with you, has appt scheduled and requesting a refill on ropinirole.  Please advise, thanks

## 2015-11-13 ENCOUNTER — Ambulatory Visit: Payer: BLUE CROSS/BLUE SHIELD | Admitting: Family Medicine

## 2015-11-22 ENCOUNTER — Ambulatory Visit: Payer: BLUE CROSS/BLUE SHIELD | Admitting: Internal Medicine

## 2015-11-29 ENCOUNTER — Ambulatory Visit: Payer: BLUE CROSS/BLUE SHIELD | Admitting: Family Medicine

## 2015-12-17 ENCOUNTER — Other Ambulatory Visit: Payer: Self-pay | Admitting: Family Medicine

## 2015-12-17 ENCOUNTER — Encounter: Payer: Self-pay | Admitting: Family Medicine

## 2015-12-17 ENCOUNTER — Ambulatory Visit (INDEPENDENT_AMBULATORY_CARE_PROVIDER_SITE_OTHER): Payer: BLUE CROSS/BLUE SHIELD | Admitting: Family Medicine

## 2015-12-17 DIAGNOSIS — F419 Anxiety disorder, unspecified: Secondary | ICD-10-CM | POA: Diagnosis not present

## 2015-12-17 DIAGNOSIS — I10 Essential (primary) hypertension: Secondary | ICD-10-CM | POA: Diagnosis not present

## 2015-12-17 DIAGNOSIS — G2581 Restless legs syndrome: Secondary | ICD-10-CM

## 2015-12-17 MED ORDER — ROPINIROLE HCL 1 MG PO TABS: 1.0000 mg | ORAL_TABLET | Freq: Every day | ORAL | 3 refills | Status: DC

## 2015-12-17 MED ORDER — FLUOXETINE HCL 20 MG PO CAPS: 20.0000 mg | ORAL_CAPSULE | Freq: Every day | ORAL | 3 refills | Status: DC

## 2015-12-17 MED ORDER — CLONIDINE HCL 0.1 MG PO TABS: 0.1000 mg | ORAL_TABLET | Freq: Three times a day (TID) | ORAL | 3 refills | Status: DC

## 2015-12-17 NOTE — Assessment & Plan Note (Signed)
Stable. Continue Klonopin and Prozac. Prozac refilled today.

## 2015-12-17 NOTE — Assessment & Plan Note (Signed)
Uncontrolled. Adding Clonidine (given intolerance to numerous other meds). Continue Metoprolol.

## 2015-12-17 NOTE — Assessment & Plan Note (Signed)
Stable. Requip refilled. Patient given okay to titrate off meds (slowly) if desired.

## 2015-12-17 NOTE — Progress Notes (Signed)
Pre visit review using our clinic review tool, if applicable. No additional management support is needed unless otherwise documented below in the visit note. 

## 2015-12-17 NOTE — Patient Instructions (Signed)
Continue your meds.  I have added Clonidine.  Check your BP's daily.  If continue to stay above 140/90 please let me know.  Take care  Follow up in 1-3 months.  Dr. Adriana Simasook

## 2015-12-17 NOTE — Progress Notes (Signed)
Subjective:  Patient ID: Elizabeth LemonKaren A Dunn, female    DOB: July 03, 1960  Age: 55 y.o. MRN: [id number]  CC: Follow up, establish care with me  HPI:  55 year old female with RLS, Anxiety, HTN presents for follow up.  HTN  Uncontrolled.  Patient is only currently taking metoprolol. It is listed that she should be taking hydralazine and Lasix as well. She has discontinued these medications.  BP markedly elevated today.  Patient has intolerance to numerous medications.  Will address today.   RLS  Stable. On Requip and Gabapentin.  Patient would like to come of some of her medication due to concern for weight gain.  Requesting refill on Requip today.  Anxiety  Stable.  On Klonopin, Prozac.  Needs refill on Prozac today.  Social Hx   Social History   Social History  . Marital status: Married    Spouse name: N/A  . Number of children: N/A  . Years of education: N/A   Social History Main Topics  . Smoking status: Never Smoker  . Smokeless tobacco: Never Used  . Alcohol use Yes     Comment: 1 glass of wine  . Drug use: No  . Sexual activity: Not Asked   Other Topics Concern  . None   Social History Narrative   [place] in [place]. Worked as Geologist, engineeringteacher assistant.  Married with 27Yo and 21YO children.   Review of Systems  Constitutional: Negative.   Psychiatric/Behavioral: The patient is nervous/anxious.    Objective:  BP (!) 183/90 (BP Location: Right Arm, Patient Position: Sitting, Cuff Size: Normal)   Pulse 80   Temp 98.2 F (36.8 C) (Oral)   Wt 212 lb (96.2 kg)   SpO2 98%   BMI 31.31 kg/m   BP/Weight 12/17/2015 06/27/2015 06/20/2015  Systolic BP 183 156 146  Diastolic BP 90 96 79  Wt. (Lbs) 212 210.5 210  BMI 31.31 31.07 31.46   Physical Exam  Constitutional: She is oriented to person, place, and time. She appears well-developed. No distress.  Cardiovascular: Normal rate and regular rhythm.   Pulmonary/Chest: Effort normal. She has no wheezes. She has  no rales.  Neurological: She is alert and oriented to person, place, and time.  Psychiatric: She has a normal mood and affect.  Vitals reviewed.  Lab Results  Component Value Date   WBC 8.2 06/20/2015   HGB 13.8 06/20/2015   HCT 40.6 06/20/2015   PLT 365.0 06/20/2015   GLUCOSE 116 (H) 06/20/2015   CHOL 238 (H) 03/09/2015   TRIG 116.0 03/09/2015   HDL 54.50 03/09/2015   LDLCALC 160 (H) 03/09/2015   ALT 35 06/20/2015   AST 23 06/20/2015   NA 139 06/20/2015   K 5.1 06/20/2015   CL 105 06/20/2015   CREATININE 0.67 06/20/2015   BUN 15 06/20/2015   CO2 28 06/20/2015   TSH 1.93 06/20/2015   HGBA1C 5.7 07/25/2014   Assessment & Plan:   Problem List Items Addressed This Visit    Restless leg syndrome    Stable. Requip refilled. Patient given okay to titrate off meds (slowly) if desired.       Hypertension    Uncontrolled. Adding Clonidine (given intolerance to numerous other meds). Continue Metoprolol.       Relevant Medications   cloNIDine (CATAPRES) 0.1 MG tablet   Anxiety    Stable. Continue Klonopin and Prozac. Prozac refilled today.       Relevant Medications   FLUoxetine (PROZAC) 20 MG capsule  Other Visit Diagnoses   None.     Meds ordered this encounter  Medications  . FLUoxetine (PROZAC) 20 MG capsule    Sig: Take 1 capsule (20 mg total) by mouth daily.    Dispense:  90 capsule    Refill:  3  . rOPINIRole (REQUIP) 1 MG tablet    Sig: Take 1 tablet (1 mg total) by mouth at bedtime.    Dispense:  90 tablet    Refill:  3    Please schedule follow up appointment before any more refills  . cloNIDine (CATAPRES) 0.1 MG tablet    Sig: Take 1 tablet (0.1 mg total) by mouth 3 (three) times daily.    Dispense:  90 tablet    Refill:  3    Follow-up: 1-3 months.   Everlene OtherJayce Dustin Bumbaugh DO [facility] [place] [facility]

## 2015-12-20 ENCOUNTER — Telehealth: Payer: Self-pay | Admitting: Internal Medicine

## 2015-12-20 NOTE — Telephone Encounter (Signed)
Patient has been informed.

## 2015-12-20 NOTE — Telephone Encounter (Signed)
Yes. She can stop. Follow up with me regarding BP.

## 2015-12-20 NOTE — Telephone Encounter (Signed)
Pt called about the medication cloNIDine (CATAPRES) 0.1 MG tablet it has made her nauseous, shaky,anxious. Please advise?  Call pt @ [phone]. Thank you!

## 2015-12-20 NOTE — Telephone Encounter (Signed)
Inform patient to stop medication and schedule appointment? please advise.

## 2016-01-17 ENCOUNTER — Ambulatory Visit: Payer: BLUE CROSS/BLUE SHIELD | Admitting: Family Medicine

## 2016-01-18 ENCOUNTER — Other Ambulatory Visit: Payer: Self-pay | Admitting: Family Medicine

## 2016-01-18 MED ORDER — CLONAZEPAM 1 MG PO TABS: 1.0000 mg | ORAL_TABLET | Freq: Three times a day (TID) | ORAL | 3 refills | Status: DC | PRN

## 2016-01-18 NOTE — Telephone Encounter (Signed)
Refilled 06/27/15 for 90-day and 3 refills. Pt last seen 12/17/15. Please advise?

## 2016-01-18 NOTE — Telephone Encounter (Signed)
faxed

## 2016-02-10 ENCOUNTER — Encounter: Payer: Self-pay | Admitting: Family Medicine

## 2016-02-11 ENCOUNTER — Other Ambulatory Visit: Payer: Self-pay | Admitting: Family Medicine

## 2016-02-11 DIAGNOSIS — M1990 Unspecified osteoarthritis, unspecified site: Secondary | ICD-10-CM

## 2016-02-11 MED ORDER — ETODOLAC 500 MG PO TABS: 500.0000 mg | ORAL_TABLET | Freq: Two times a day (BID) | ORAL | 0 refills | Status: DC | PRN

## 2016-04-08 ENCOUNTER — Other Ambulatory Visit: Payer: Self-pay | Admitting: Family Medicine

## 2016-04-15 ENCOUNTER — Other Ambulatory Visit: Payer: Self-pay | Admitting: *Deleted

## 2016-04-15 MED ORDER — GABAPENTIN 100 MG PO CAPS: 100.0000 mg | ORAL_CAPSULE | Freq: Every day | ORAL | 3 refills | Status: DC

## 2016-04-15 NOTE — Telephone Encounter (Signed)
Patient  requested a medication refill for gabapentin  [facility]

## 2016-04-15 NOTE — Telephone Encounter (Signed)
Refilled 09/14/15. Pt last seen 12/17/15. Please advise?

## 2016-05-16 ENCOUNTER — Other Ambulatory Visit: Payer: Self-pay

## 2016-05-16 DIAGNOSIS — Z1239 Encounter for other screening for malignant neoplasm of breast: Secondary | ICD-10-CM

## 2016-05-16 NOTE — Progress Notes (Signed)
LVTCB

## 2016-07-04 ENCOUNTER — Ambulatory Visit: Payer: BLUE CROSS/BLUE SHIELD | Attending: Family Medicine

## 2016-08-04 ENCOUNTER — Other Ambulatory Visit: Payer: Self-pay | Admitting: Family Medicine

## 2016-08-04 NOTE — Telephone Encounter (Signed)
Refilled: 12/17/15 Last OV: 12/17/15 Last Labs: 06/20/15 Future OV: none Please advise?

## 2016-08-12 ENCOUNTER — Other Ambulatory Visit: Payer: Self-pay | Admitting: Family Medicine

## 2016-08-13 NOTE — Telephone Encounter (Signed)
Refilled: 01/18/16 Last OV: 12/17/15 Last Labs: 06/20/15 Future OV: none Please advise?

## 2016-08-14 NOTE — Telephone Encounter (Signed)
faxed

## 2016-08-18 ENCOUNTER — Ambulatory Visit
Admission: RE | Admit: 2016-08-18 | Discharge: 2016-08-18 | Disposition: A | Discharge status: Home / Self Care | Payer: Self-pay | Source: Ambulatory Visit | Attending: Family Medicine | Admitting: Family Medicine

## 2016-08-18 ENCOUNTER — Encounter: Payer: Self-pay | Admitting: Family Medicine

## 2016-08-18 ENCOUNTER — Ambulatory Visit (INDEPENDENT_AMBULATORY_CARE_PROVIDER_SITE_OTHER): Payer: Self-pay | Admitting: Family Medicine

## 2016-08-18 ENCOUNTER — Other Ambulatory Visit: Payer: Self-pay | Admitting: Family Medicine

## 2016-08-18 VITALS — BP 150/108 | HR 122 | Temp 98.2°F | Wt 220.0 lb

## 2016-08-18 DIAGNOSIS — R221 Localized swelling, mass and lump, neck: Secondary | ICD-10-CM | POA: Insufficient documentation

## 2016-08-18 LAB — COMPREHENSIVE METABOLIC PANEL
ALK PHOS: 84 U/L (ref 39–117)
ALT: 57 U/L — AB (ref 0–35)
AST: 49 U/L — ABNORMAL HIGH (ref 0–37)
Albumin: 4.4 g/dL (ref 3.5–5.2)
BUN: 13 mg/dL (ref 6–23)
CO2: 26 meq/L (ref 19–32)
Calcium: 9.2 mg/dL (ref 8.4–10.5)
Chloride: 104 mEq/L (ref 96–112)
Creatinine, Ser: 0.77 mg/dL (ref 0.40–1.20)
GFR: 82.5 mL/min (ref >60.00)
GLUCOSE: 122 mg/dL — AB (ref 70–99)
POTASSIUM: 4.4 meq/L (ref 3.5–5.1)
SODIUM: 137 meq/L (ref 135–145)
TOTAL PROTEIN: 7.3 g/dL (ref 6.0–8.3)
Total Bilirubin: 0.3 mg/dL (ref 0.2–1.2)

## 2016-08-18 LAB — CBC WITH DIFFERENTIAL/PLATELET
BASOS PCT: 0.6 % (ref 0.0–3.0)
Basophils Absolute: 0 10*3/uL (ref 0.0–0.1)
EOS PCT: 3 % (ref 0.0–5.0)
Eosinophils Absolute: 0.2 10*3/uL (ref 0.0–0.7)
HCT: 43.5 % (ref 36.0–46.0)
HEMOGLOBIN: 14.6 g/dL (ref 12.0–15.0)
LYMPHS ABS: 1.6 10*3/uL (ref 0.7–4.0)
Lymphocytes Relative: 24 % (ref 12.0–46.0)
MCHC: 33.5 g/dL (ref 30.0–36.0)
MCV: 94.8 fl (ref 78.0–100.0)
MONO ABS: 0.5 10*3/uL (ref 0.1–1.0)
MONOS PCT: 7 % (ref 3.0–12.0)
Neutro Abs: 4.4 10*3/uL (ref 1.4–7.7)
Neutrophils Relative %: 65.4 % (ref 43.0–77.0)
Platelets: 342 10*3/uL (ref 150.0–400.0)
RBC: 4.59 Mil/uL (ref 3.87–5.11)
RDW: 12.9 % (ref 11.5–15.5)
WBC: 6.7 10*3/uL (ref 4.0–10.5)

## 2016-08-18 MED ORDER — IOPAMIDOL (ISOVUE-300) INJECTION 61%
75.0000 mL | Freq: Once | INTRAVENOUS | Status: AC | PRN
  Administered 2016-08-18: 75 mL via INTRAVENOUS

## 2016-08-18 NOTE — Progress Notes (Signed)
Subjective:  Patient ID: Elizabeth Dunn, female    DOB: 11-Jan-1961  Age: 56 y.o. MRN: [id number]  CC: Neck swelling  HPI:  56 year old female with hypertension, anxiety presents with the above complaint.  Patient reports a one-month history of localized swelling to the left supraclavicular region. Comes and goes. Has been persistent throughout this period of time. On Friday she noticed some swelling left side of her face and around the jawline and preauricular region. No recent illness. No fever. No chills. No night sweats. No weight loss. Mild fatigue. No associated shortness of breath or chest pain. No known inciting factor. No known exacerbating or relieving factors. She reports some associated paresthesias around the left jaw. She is concerned about her symptoms. No other complaints at this time.  Social Hx   Social History   Social History  . Marital status: Married    Spouse name: N/A  . Number of children: N/A  . Years of education: N/A   Social History Main Topics  . Smoking status: Never Smoker  . Smokeless tobacco: Never Used  . Alcohol use Yes     Comment: 1 glass of wine  . Drug use: No  . Sexual activity: Not Asked   Other Topics Concern  . None   Social History Narrative   Lives in [place]. 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    Review of Systems  Constitutional: Positive for fatigue.  HENT:       Swelling/mass, left supraclavicular region and left sided jaw/preauricular region.   Objective:  BP (!) 150/108   Pulse (!) 122   Temp 98.2 F (36.8 C) (Oral)   Wt 220 lb (99.8 kg)   SpO2 98%   BMI 32.49 kg/m   BP/Weight 08/18/2016 12/17/2015 06/27/2015  Systolic BP 150 183 156  Diastolic BP 108 90 96  Wt. (Lbs) 220 212 210.5  BMI 32.49 31.31 31.07   Physical Exam  Constitutional: She is oriented to person, place, and time. She appears well-developed. No distress.  HENT:  Preauricular region as well as region around the  angle of the mandible - firmness to palpation, mass noted. ? Lymphadenopathy.  Neck:  Fullness noted in the left supraclavicular region. No discrete nodule/mass appreciated.   Cardiovascular: Regular rhythm.  Tachycardia present.   Pulmonary/Chest: Effort normal and breath sounds normal.  Neurological: She is alert and oriented to person, place, and time.  Psychiatric: She has a normal mood and affect.  Vitals reviewed.   Lab Results  Component Value Date   WBC 8.2 06/20/2015   HGB 13.8 06/20/2015   HCT 40.6 06/20/2015   PLT 365.0 06/20/2015   GLUCOSE 116 (H) 06/20/2015   CHOL 238 (H) 03/09/2015   TRIG 116.0 03/09/2015   HDL 54.50 03/09/2015   LDLCALC 160 (H) 03/09/2015   ALT 35 06/20/2015   AST 23 06/20/2015   NA 139 06/20/2015   K 5.1 06/20/2015   CL 105 06/20/2015   CREATININE 0.67 06/20/2015   BUN 15 06/20/2015   CO2 28 06/20/2015   TSH 1.93 06/20/2015   HGBA1C 5.7 07/25/2014    Assessment & Plan:   Problem List Items Addressed This Visit    Localized swelling, mass and lump, neck - Primary    New problem. Uncertain etiology/prognosis at this time. Labs today. CT scan neck today.       Relevant Orders   Comprehensive metabolic panel   CT SOFT TISSUE NECK W CONTRAST  CBC w/Diff     Follow-up: Pending work up.  Everlene Other DO [facility]

## 2016-08-18 NOTE — Patient Instructions (Signed)
We will call with the results.  Take care  Dr. Jia Dottavio  

## 2016-08-18 NOTE — Progress Notes (Signed)
Pre visit review using our clinic review tool, if applicable. No additional management support is needed unless otherwise documented below in the visit note. 

## 2016-08-18 NOTE — Telephone Encounter (Signed)
Refilled: 04/15/16 Last OV: 08/18/16 Last Labs: 08/18/16 Future OV: none Please advise?

## 2016-08-18 NOTE — Assessment & Plan Note (Signed)
New problem. Uncertain etiology/prognosis at this time. Labs today. CT scan neck today.

## 2016-08-19 ENCOUNTER — Telehealth: Payer: Self-pay | Admitting: Family Medicine

## 2016-08-19 NOTE — Telephone Encounter (Signed)
LVTCB. Labs have not been resulted by PCP yet due to hom being out of the office.

## 2016-08-19 NOTE — Telephone Encounter (Signed)
Pt called wanting to her lab results from yesterday. Pt also would like to talk about her BP. Please advise?  Call pt @ [phone]. Thank you!

## 2016-08-19 NOTE — Telephone Encounter (Signed)
Pt called back and was told that results were not yet resulted by PCP. She stated that she was worried about BP being elevated and pulse. She was told to monitor and log and to call on Friday with the numbers. Pt is requesting an excuse from work for Sunday 4/22-4/26. Please advise?

## 2016-08-20 ENCOUNTER — Encounter: Payer: Self-pay | Admitting: Family Medicine

## 2016-08-20 NOTE — Telephone Encounter (Signed)
Pt called and scheduled for 09/10/16 at 2 p.m. Please place orders.

## 2016-08-20 NOTE — Telephone Encounter (Signed)
Blood work was normal except for mildly elevated LFT's. Needs recheck in 2-4 weeks.

## 2016-08-21 ENCOUNTER — Encounter: Payer: Self-pay | Admitting: Family Medicine

## 2016-08-22 ENCOUNTER — Other Ambulatory Visit: Payer: Self-pay | Admitting: Family Medicine

## 2016-08-22 NOTE — Telephone Encounter (Signed)
Pt called back to follow up from this morning. Thank you!

## 2016-08-22 NOTE — Telephone Encounter (Signed)
Pt called back looking for an update. She did go to [facility] because her bp was so high. She is just taking clonidine. Please advise, thank you!  Call pt @ [phone]

## 2016-08-26 ENCOUNTER — Other Ambulatory Visit: Payer: Self-pay | Admitting: Family Medicine

## 2016-08-26 DIAGNOSIS — M1990 Unspecified osteoarthritis, unspecified site: Secondary | ICD-10-CM

## 2016-08-26 NOTE — Telephone Encounter (Signed)
Refilled: 02/11/16 Last OV: 08/18/16 Last Labs: 08/18/16 Future OV: none Please advise?

## 2016-09-01 ENCOUNTER — Encounter: Payer: Self-pay | Admitting: Family Medicine

## 2016-09-09 ENCOUNTER — Telehealth: Payer: Self-pay | Admitting: Radiology

## 2016-09-09 ENCOUNTER — Other Ambulatory Visit: Payer: Self-pay | Admitting: Family Medicine

## 2016-09-09 DIAGNOSIS — R945 Abnormal results of liver function studies: Principal | ICD-10-CM

## 2016-09-09 DIAGNOSIS — R7989 Other specified abnormal findings of blood chemistry: Secondary | ICD-10-CM

## 2016-09-09 NOTE — Telephone Encounter (Signed)
Pt coming in for labs tomorrow, please place future orders. Thank you.  

## 2016-09-10 ENCOUNTER — Other Ambulatory Visit: Payer: Self-pay

## 2016-09-18 ENCOUNTER — Other Ambulatory Visit: Payer: Self-pay | Admitting: Family Medicine

## 2016-09-18 NOTE — Telephone Encounter (Signed)
Klonopin 1 mg Last OV 08/18/16 Last Refilled 08/14/16 Last OV: 08/18/2016 No showed 09/10/16

## 2016-09-19 NOTE — Telephone Encounter (Signed)
Faxed

## 2016-10-15 ENCOUNTER — Other Ambulatory Visit: Payer: Self-pay | Admitting: Family Medicine

## 2016-10-15 NOTE — Telephone Encounter (Signed)
Please advise, thanks.

## 2016-10-15 NOTE — Telephone Encounter (Signed)
Last office visit 08/18/16, acute No show 09/12/16

## 2016-10-16 ENCOUNTER — Encounter: Payer: Self-pay | Admitting: Family Medicine

## 2016-10-16 NOTE — Telephone Encounter (Signed)
Script [facility] to [facility]

## 2016-10-24 ENCOUNTER — Other Ambulatory Visit: Payer: Self-pay | Admitting: Family Medicine

## 2016-11-13 ENCOUNTER — Encounter: Payer: Self-pay | Admitting: Family Medicine

## 2016-11-13 ENCOUNTER — Other Ambulatory Visit: Payer: Self-pay | Admitting: Family Medicine

## 2016-12-19 NOTE — Telephone Encounter (Signed)
Error

## 2016-12-22 ENCOUNTER — Other Ambulatory Visit: Payer: Self-pay | Admitting: Family Medicine

## 2016-12-22 NOTE — Telephone Encounter (Signed)
It appears she has not followed up regarding this issue in about a year. She needs a follow-up appointment scheduled prior to refills being given. Once follow-up has been scheduled by will be able to provide refills.

## 2016-12-22 NOTE — Telephone Encounter (Signed)
Last OV 08/18/2016 Next OV not scheduled Last refill 10/15/2016

## 2016-12-23 ENCOUNTER — Telehealth: Payer: Self-pay | Admitting: Family Medicine

## 2016-12-23 NOTE — Telephone Encounter (Signed)
Please advise in the absence of Dr. Adriana Simas, she called pharmacy and they stated it was last filled in July with no refills.    I called and spoke with pharmacist 6/20 received rx  For #90 with 1 refill and fill dates were ,  6/21, 7/21, so no refills left.   Thanks

## 2016-12-23 NOTE — Telephone Encounter (Signed)
Pt needs a refill on clonazePAM (KLONOPIN) 1 MG tablet. Pt states there are no refills at the Pharmacy last refilled 11/21/16

## 2016-12-24 ENCOUNTER — Other Ambulatory Visit: Payer: Self-pay | Admitting: Family Medicine

## 2016-12-24 NOTE — Telephone Encounter (Signed)
Refill can be faxed to the pharmacy. Patient has not been seen for her anxiety in the past year. She should be set up with a follow-up appointment. Thanks.

## 2016-12-24 NOTE — Telephone Encounter (Signed)
See telephone note regarding this in [facility] in[facility]asket. thanks

## 2016-12-24 NOTE — Telephone Encounter (Signed)
See below , thanks

## 2016-12-24 NOTE — Telephone Encounter (Signed)
Already refilled. Please confirm this was faxed. Thanks.

## 2016-12-24 NOTE — Telephone Encounter (Signed)
faxed

## 2017-01-02 ENCOUNTER — Other Ambulatory Visit: Payer: Self-pay | Admitting: Family Medicine

## 2017-01-06 ENCOUNTER — Ambulatory Visit (INDEPENDENT_AMBULATORY_CARE_PROVIDER_SITE_OTHER): Payer: Self-pay | Admitting: Family Medicine

## 2017-01-06 ENCOUNTER — Telehealth: Payer: Self-pay | Admitting: Family Medicine

## 2017-01-06 ENCOUNTER — Encounter: Payer: Self-pay | Admitting: Family Medicine

## 2017-01-06 VITALS — BP 184/100 | HR 135 | Temp 98.7°F | Wt 210.2 lb

## 2017-01-06 DIAGNOSIS — R0789 Other chest pain: Secondary | ICD-10-CM

## 2017-01-06 DIAGNOSIS — I10 Essential (primary) hypertension: Secondary | ICD-10-CM

## 2017-01-06 DIAGNOSIS — R000 Tachycardia, unspecified: Secondary | ICD-10-CM

## 2017-01-06 LAB — COMPREHENSIVE METABOLIC PANEL
ALBUMIN: 4.4 g/dL (ref 3.5–5.2)
ALK PHOS: 94 U/L (ref 39–117)
ALT: 106 U/L — ABNORMAL HIGH (ref 0–35)
AST: 81 U/L — AB (ref 0–37)
BUN: 15 mg/dL (ref 6–23)
CALCIUM: 9.7 mg/dL (ref 8.4–10.5)
CHLORIDE: 101 meq/L (ref 96–112)
CO2: 24 mEq/L (ref 19–32)
CREATININE: 0.9 mg/dL (ref 0.40–1.20)
GFR: 68.81 mL/min (ref >60.00)
Glucose, Bld: 122 mg/dL — ABNORMAL HIGH (ref 70–99)
Potassium: 4.7 mEq/L (ref 3.5–5.1)
Sodium: 136 mEq/L (ref 135–145)
TOTAL PROTEIN: 8 g/dL (ref 6.0–8.3)
Total Bilirubin: 0.8 mg/dL (ref 0.2–1.2)

## 2017-01-06 LAB — CBC
HCT: 40.9 % (ref 36.0–46.0)
Hemoglobin: 13.6 g/dL (ref 12.0–15.0)
MCHC: 33.3 g/dL (ref 30.0–36.0)
MCV: 94.9 fl (ref 78.0–100.0)
Platelets: 255 10*3/uL (ref 150.0–400.0)
RBC: 4.31 Mil/uL (ref 3.87–5.11)
RDW: 12.7 % (ref 11.5–15.5)
WBC: 5.2 10*3/uL (ref 4.0–10.5)

## 2017-01-06 LAB — TSH: TSH: 2.23 u[IU]/mL (ref 0.35–4.50)

## 2017-01-06 LAB — TROPONIN I: TNIDX: 0 ug/L (ref 0.00–0.06)

## 2017-01-06 MED ORDER — CARVEDILOL 6.25 MG PO TABS: 6.2500 mg | ORAL_TABLET | Freq: Two times a day (BID) | ORAL | 3 refills | Status: DC

## 2017-01-06 NOTE — Progress Notes (Signed)
[name], MD Phone: [phone]  Elizabeth Dunn is a 56 y.o. female who presents today for same-day visit.  Hypertension: Patient notes recent issues with her blood pressure. Has been running in the 150s-160s over 90s-100s at home. Heart rate has also been slightly elevated up into the 110s-120 at times. She notes she was off medication for some time though restarted the clonidine about a week ago. Has not taken it today. She stopped the clonidine due to swelling in the left side of her neck that was evaluated previously though went away with clonidine being discontinued. She notes no chest pain though did have an uncomfortable sensation in her chest with palpitations this morning. She reports for a number of months she has woken up each morning with this sensation. The uncomfortable sensation goes away relatively quickly though she does occasionally have palpitations. She's had palpitations intermittently since May. She notes no shortness of breath or edema. Some intermittent sweats though not associated with her palpitations or uncomfortableness. She has had no discomfort in her chest since this morning. She has no current symptoms. She has previously been evaluated by cardiology a number years ago for sinus tachycardia. It appears she had a 48 hour Holter monitor that showed mostly normal sinus rhythm though also showed sinus tachycardia and a very rare short run of SVT. She was taking carvedilol at that time. Heart rate at that time typically in the 100-120 bpm range. At that time her heart rate responded well to carvedilol.  PMH: nonsmoker.   ROS see history of present illness  Objective  Physical Exam Vitals:   01/06/17 1455 01/06/17 1530  BP: (!) 190/98 (!) 184/100  Pulse: (!) 135   Temp: 98.7 F (37.1 C)   SpO2: 97%     BP Readings from Last 3 Encounters:  01/06/17 (!) 184/100  08/18/16 (!) 150/108  12/17/15 (!) 183/90   Wt Readings from Last 3 Encounters:  01/06/17  210 lb 3.2 oz (95.3 kg)  08/18/16 220 lb (99.8 kg)  12/17/15 212 lb (96.2 kg)    Physical Exam  Constitutional: No distress.  Cardiovascular: Regular rhythm and normal heart sounds.  Tachycardia present.   Pulmonary/Chest: Effort normal and breath sounds normal.  Musculoskeletal: She exhibits no edema.  Neurological: She is alert. Gait normal.  Skin: Skin is warm and dry. She is not diaphoretic.   EKG: Sinus tachycardia, rate 131, no ischemic changes noted  Assessment/Plan: Please see individual problem list.  Hypertension BP elevated today. Has been better controlled at home. Also with sinus tachycardia. Symptoms appear similar to symptoms she previously had when evaluated by cardiology. Had some palpitations with slight uncomfortableness earlier this morning and has not had any recurrence of that. That has been a recurrent issue over a number of months though. She is currently back on clonidine. EKG reassuring other than sinus tachycardia. We will obtain lab work. I suspect the uncomfortableness she had in her chest is related to the palpitations though we will check a troponin given that it has been 7-8 hours since she had that episode and if negative in conjunction with no ischemic changes on EKG I would doubt ischemic cause. She has had recurrent issues with sinus tachycardia in the past and did respond well to carvedilol. Once her lab work returns we will likely start her on carvedilol. We'll have her return in about a week for recheck. Discussed that if she develops any recurrent chest discomfort, trouble breathing, persistent palpitations, or any other  symptoms she should be evaluated in the emergency room.   Orders Placed This Encounter  Procedures  . Comp Met (CMET)  . CBC  . TSH  . Troponin I  . EKG 12-Lead   [name], MD [place]

## 2017-01-06 NOTE — Assessment & Plan Note (Addendum)
BP elevated today. Has been better controlled at home. Also with sinus tachycardia. Symptoms appear similar to symptoms she previously had when evaluated by cardiology. Had some palpitations with slight uncomfortableness earlier this morning and has not had any recurrence of that. That has been a recurrent issue over a number of months though. She is currently back on clonidine. EKG reassuring other than sinus tachycardia. We will obtain lab work. I suspect the uncomfortableness she had in her chest is related to the palpitations though we will check a troponin given that it has been 7-8 hours since she had that episode and if negative in conjunction with no ischemic changes on EKG I would doubt ischemic cause. She has had recurrent issues with sinus tachycardia in the past and did respond well to carvedilol. Once her lab work returns we will likely start her on carvedilol. We'll have her return in about a week for recheck. Discussed that if she develops any recurrent chest discomfort, trouble breathing, persistent palpitations, or any other symptoms she should be evaluated in the emergency room.

## 2017-01-06 NOTE — Patient Instructions (Signed)
Nice to see you. We're going to check some lab work and we'll call you with the results. Once your lab work returns we will start you on blood pressure medication. If you develop chest pain, shortness of breath, worsening palpitations, or any new or changing symptoms please go to the emergency room.

## 2017-01-06 NOTE — Telephone Encounter (Signed)
Spoke with patient regarding results. Troponin was negative. LFTs are slightly higher than they were previously. Discussed that she'll need further lab work and imaging evaluation. We will have her follow-up in one week for recheck of blood pressure and pulse after she's been on carvedilol which I did send to her pharmacy.

## 2017-01-07 NOTE — Telephone Encounter (Signed)
Patient is scheduled   

## 2017-01-07 NOTE — Telephone Encounter (Signed)
Left message to return call 

## 2017-01-13 ENCOUNTER — Other Ambulatory Visit: Payer: Self-pay | Admitting: Family Medicine

## 2017-01-14 ENCOUNTER — Ambulatory Visit (INDEPENDENT_AMBULATORY_CARE_PROVIDER_SITE_OTHER): Payer: Self-pay | Admitting: *Deleted

## 2017-01-14 DIAGNOSIS — R000 Tachycardia, unspecified: Secondary | ICD-10-CM

## 2017-01-14 DIAGNOSIS — I10 Essential (primary) hypertension: Secondary | ICD-10-CM

## 2017-01-14 NOTE — Progress Notes (Signed)
Patient presented for BP fu from 01/06/17 patient was placed on carvedilol 6.25 mg, patient stated she cannot take this medication due to it makes want to stay in the bed all time. Patient stated yesterday she did not take the carvedilol but took Lisinopril 20 mg at 4 pm and again at bedtime  For total of 40 mg of lisinopril. Patient BP today she has taken no medications for BP left arm 170/104 pulse 107, right arm 180/106 pulse 105. Patient denies any chest pain stated she actually feels better today than when she takes the carvedilol. Spoke with Dr. Birdie Sons was advised to find out why patient had lisinopril down as an allergy a long with losartan , patient staed the losartan made her cough she thought , and that losartan caused her legs to swell, but taking the lisinopril for the one day did not bother her. Patient stated she has not had and is having no chest pain or SOB, she feels fine today just anxious.

## 2017-01-14 NOTE — Progress Notes (Signed)
I am going to forward to our clinical pharmacist to review the patients allergies. She appears to have mostly intolerances to BP medications she has tried at this point. I believe it might be lisinopril that caused cough. We may need to try hydralazine as the next option.

## 2017-01-15 ENCOUNTER — Telehealth: Payer: Self-pay | Admitting: Family Medicine

## 2017-01-15 MED ORDER — CHLORTHALIDONE 25 MG PO TABS: 25.0000 mg | ORAL_TABLET | Freq: Every day | ORAL | 1 refills | Status: DC

## 2017-01-15 NOTE — Progress Notes (Signed)
I have included the message from [place] below.   Please let the patient know that we have several options. We could try a lower dose of coreg as fatigue is common in the first several weeks of being on coreg or try another medication all together. If she wants to try another medication I would suggest chlorthalidone. We would need to check labs in one month if she would like to try that medication.     "Hey,  Looks like she is "self pay" AKA no insurance - is this right? All the below are available on different discount Rx lists.   Can try lower dose of carvedilol and titrate upward, can try different ARB if it was just leg swelling   Can try spironolactone (K was not high, renal function looks stable) , chlorthalidone or indapamide, different CCB - looks like from her HR that she could handle a diltiazem   I'd give these a shot before going to hydralazine   I'd definitely talk to about fatigue being normal in the first few weeks as her body adjusts to a lower BP.   Caroline "   Marikay Alar, MD

## 2017-01-15 NOTE — Addendum Note (Signed)
Addended by: Glori Luis on: 01/15/2017 06:22 PM   Modules accepted: Orders

## 2017-01-15 NOTE — Telephone Encounter (Signed)
See clinical note

## 2017-01-15 NOTE — Progress Notes (Signed)
Sent to pharmacy. She will need a BP check at that time as well. Thanks.

## 2017-01-15 NOTE — Progress Notes (Signed)
Left message for patient to return call to office. 

## 2017-01-15 NOTE — Telephone Encounter (Signed)
Pt called back returning your call. Please advise, thank you!  Call pt @ [phone]

## 2017-01-15 NOTE — Progress Notes (Signed)
Patient would like to try the chlorthalidone , I have already scheduled labs for end of October.

## 2017-01-20 ENCOUNTER — Other Ambulatory Visit: Payer: Self-pay | Admitting: Family Medicine

## 2017-01-21 NOTE — Telephone Encounter (Signed)
Please fax

## 2017-01-21 NOTE — Telephone Encounter (Signed)
faxed

## 2017-01-21 NOTE — Telephone Encounter (Signed)
Last OV 01/06/2017 Next OV 02/23/2017 Last refill 12/24/2016

## 2017-02-02 ENCOUNTER — Encounter: Payer: Self-pay | Admitting: Family Medicine

## 2017-02-04 ENCOUNTER — Other Ambulatory Visit: Payer: Self-pay | Admitting: Family Medicine

## 2017-02-04 MED ORDER — CLONIDINE HCL 0.1 MG PO TABS: 0.1000 mg | ORAL_TABLET | Freq: Three times a day (TID) | ORAL | 1 refills | Status: DC

## 2017-02-12 ENCOUNTER — Encounter: Payer: Self-pay | Admitting: Family Medicine

## 2017-02-23 ENCOUNTER — Other Ambulatory Visit: Payer: Self-pay

## 2017-03-25 ENCOUNTER — Other Ambulatory Visit: Payer: Self-pay | Admitting: Family Medicine

## 2017-03-25 ENCOUNTER — Encounter: Payer: Self-pay | Admitting: Family Medicine

## 2017-03-26 NOTE — Telephone Encounter (Signed)
Last OV 01/06/17 last filled 01/21/17 90 1rf

## 2017-03-27 NOTE — Telephone Encounter (Signed)
Patient needs follow-up for anxiety scheduled.  Thanks.

## 2017-03-30 ENCOUNTER — Telehealth: Payer: Self-pay

## 2017-03-30 MED ORDER — FLUOXETINE HCL 20 MG PO CAPS: ORAL_CAPSULE | ORAL | 1 refills | Status: DC

## 2017-03-30 NOTE — Telephone Encounter (Addendum)
Klonopin previously refilled. Prozac sent to pharmacy.  She needs follow-up for her anxiety at some point in the next several months.  Thanks.

## 2017-03-30 NOTE — Telephone Encounter (Addendum)
See other phone note about Klonopin. Patient is requesting Prozac to be refilled.   Last OV 09/11 Next OV not scheduled Last refill 10/24/2016

## 2017-03-30 NOTE — Telephone Encounter (Signed)
Please call and schedule patient for medication follow up within the next few months.

## 2017-03-30 NOTE — Telephone Encounter (Signed)
Pt. Requesting Klonopin refill. Thanks.   opied from CRM [id number]. Topic: Quick Communication - Rx Refill/Question >> Mar 27, 2017  1:31 PM Viviann SpareWhite, Selina wrote: Has the patient contacted their pharmacy? Yes - pharmacy is saying she have no more refills on them clonazePAM (KLONOPIN) 1 MG tablet  FLUoxetine (PROZAC) 20 MG capsule  (Agent: If no, request that the patient contact the pharmacy for the refill.) Preferred Pharmacy (with phone number or street name):   [facility] [address] - [place], [place] - [address] [place] [address] Phone: [phone] Fax: [phone]   Agent: Please be advised that RX refills may take up to 48 hours. We ask that you follow-up with your pharmacy.

## 2017-03-30 NOTE — Addendum Note (Signed)
Addended by: Birdie SonsSONNENBERG, Anelly Samarin G on: 03/30/2017 12:43 PM   Modules accepted: Orders

## 2017-04-01 NOTE — Telephone Encounter (Signed)
Left message to return call, ok for [facility] to schedule patient for a routine follow up

## 2017-04-02 NOTE — Telephone Encounter (Signed)
Sent mychart message

## 2017-04-10 ENCOUNTER — Encounter: Payer: Self-pay | Admitting: Family Medicine

## 2017-04-13 ENCOUNTER — Other Ambulatory Visit: Payer: Self-pay | Admitting: Family Medicine

## 2017-04-14 NOTE — Telephone Encounter (Signed)
Refilled: 11/28/2014 by Dr. Dan HumphreysWalker Last OV: 01/06/2017 Next OV: 06/16/2017

## 2017-05-05 ENCOUNTER — Other Ambulatory Visit: Payer: Self-pay | Admitting: Family Medicine

## 2017-05-05 DIAGNOSIS — M1990 Unspecified osteoarthritis, unspecified site: Secondary | ICD-10-CM

## 2017-05-06 NOTE — Telephone Encounter (Signed)
Last OV 01/06/17 last filled by Dr.Cook 08/26/16 60 0rf

## 2017-05-07 NOTE — Telephone Encounter (Signed)
Patient needs to be seen for follow-up for refills.  Thanks.

## 2017-05-11 NOTE — Telephone Encounter (Signed)
Patient is scheduled for 06/16/17

## 2017-05-13 ENCOUNTER — Encounter: Payer: Self-pay | Admitting: Family Medicine

## 2017-05-13 ENCOUNTER — Other Ambulatory Visit: Payer: Self-pay | Admitting: Family Medicine

## 2017-05-13 MED ORDER — BUPROPION HCL ER (XL) 150 MG PO TB24: 150.0000 mg | ORAL_TABLET | Freq: Every day | ORAL | 0 refills | Status: DC

## 2017-05-18 ENCOUNTER — Other Ambulatory Visit: Payer: Self-pay | Admitting: Family Medicine

## 2017-05-19 MED ORDER — CLONAZEPAM 1 MG PO TABS: 1.0000 mg | ORAL_TABLET | Freq: Three times a day (TID) | ORAL | 0 refills | Status: DC | PRN

## 2017-05-19 NOTE — Telephone Encounter (Signed)
Last OV 01/06/17 last filled 03/27/17 90 0rf

## 2017-05-20 ENCOUNTER — Other Ambulatory Visit: Payer: Self-pay | Admitting: Family Medicine

## 2017-05-20 NOTE — Telephone Encounter (Signed)
faxed

## 2017-05-21 ENCOUNTER — Other Ambulatory Visit: Payer: Self-pay | Admitting: Family Medicine

## 2017-05-21 MED ORDER — GABAPENTIN 100 MG PO CAPS: ORAL_CAPSULE | ORAL | 1 refills | Status: DC

## 2017-05-21 NOTE — Telephone Encounter (Signed)
Medication refill.Last office visit 01/06/17. Thanks. Dr. Adriana Simasook did last refill. Thanks.

## 2017-05-21 NOTE — Telephone Encounter (Signed)
Copied from CRM [id number]. Topic: Quick Communication - See Telephone Encounter >> May 21, 2017 12:39 PM Arlyss Gandyichardson, Marlei Glomski N, NT wrote: CRM for notification. See Telephone encounter for: Pt needing a refill of her gabapentin (NEURONTIN). [facility]ses [facility]  05/21/17.

## 2017-05-21 NOTE — Telephone Encounter (Signed)
Please find out what she takes this for.  Thanks.  Refill given.

## 2017-05-21 NOTE — Telephone Encounter (Signed)
Please advise 

## 2017-05-21 NOTE — Telephone Encounter (Signed)
Last OV 01/06/17 last filled by Dr.Cook 01/14/17 90 1rf

## 2017-05-22 NOTE — Telephone Encounter (Signed)
Patient states she takes this for restless leg and also takes requip for restless legs

## 2017-06-10 ENCOUNTER — Other Ambulatory Visit: Payer: Self-pay | Admitting: Family Medicine

## 2017-06-16 ENCOUNTER — Ambulatory Visit: Payer: Self-pay | Admitting: Family Medicine

## 2017-07-02 ENCOUNTER — Other Ambulatory Visit: Payer: Self-pay | Admitting: Family Medicine

## 2017-07-03 NOTE — Telephone Encounter (Signed)
Last OV 01/06/17 last filled 05/19/17 90 0rf

## 2017-07-03 NOTE — Telephone Encounter (Signed)
Sent to pharmacy. Patient needs a follow-up visit. Thanks.

## 2017-07-09 ENCOUNTER — Ambulatory Visit (INDEPENDENT_AMBULATORY_CARE_PROVIDER_SITE_OTHER)
Admission: RE | Admit: 2017-07-09 | Discharge: 2017-07-09 | Disposition: A | Discharge status: Home / Self Care | Payer: Self-pay | Source: Ambulatory Visit | Attending: Internal Medicine | Admitting: Internal Medicine

## 2017-07-09 ENCOUNTER — Encounter: Payer: Self-pay | Admitting: Internal Medicine

## 2017-07-09 ENCOUNTER — Ambulatory Visit: Payer: Self-pay | Admitting: Internal Medicine

## 2017-07-09 ENCOUNTER — Ambulatory Visit
Admission: RE | Admit: 2017-07-09 | Discharge: 2017-07-09 | Disposition: A | Discharge status: Home / Self Care | Payer: Self-pay | Source: Ambulatory Visit | Attending: Internal Medicine | Admitting: Internal Medicine

## 2017-07-09 VITALS — BP 158/100 | HR 104 | Temp 98.0°F | Wt 212.0 lb

## 2017-07-09 DIAGNOSIS — M25561 Pain in right knee: Secondary | ICD-10-CM

## 2017-07-09 DIAGNOSIS — I10 Essential (primary) hypertension: Secondary | ICD-10-CM

## 2017-07-09 DIAGNOSIS — M25562 Pain in left knee: Principal | ICD-10-CM

## 2017-07-09 DIAGNOSIS — M791 Myalgia, unspecified site: Secondary | ICD-10-CM

## 2017-07-10 LAB — RHEUMATOID FACTOR: RHEUMATOID FACTOR: 23 [IU]/mL — AB (ref <14)

## 2017-07-10 LAB — HIGH SENSITIVITY CRP: CRP, High Sensitivity: 4.19 mg/L (ref 0.000–5.000)

## 2017-07-10 LAB — CK: CK TOTAL: 121 U/L (ref 7–177)

## 2017-07-10 LAB — SEDIMENTATION RATE: Sed Rate: 15 mm/hr (ref 0–30)

## 2017-07-13 LAB — ANA: Anti Nuclear Antibody(ANA): NEGATIVE

## 2017-07-14 ENCOUNTER — Encounter: Payer: Self-pay | Admitting: Internal Medicine

## 2017-07-14 ENCOUNTER — Telehealth: Payer: Self-pay | Admitting: Family Medicine

## 2017-07-14 NOTE — Telephone Encounter (Signed)
Copied from CRM [id number]. Topic: Quick Communication - See Telephone Encounter >> Jul 14, 2017 11:59 AM Jolayne Hainesaylor, Brittany L wrote: CRM for notification. See Telephone encounter for:   07/14/17.  Pt is requesting her labs from 3/14. Please call pt back @ [phone]. [place] station [facility]

## 2017-07-15 NOTE — Patient Instructions (Signed)

## 2017-07-15 NOTE — Progress Notes (Signed)
Subjective:    Patient ID: Elizabeth Dunn, female    DOB: 08-Jan-1961, 57 y.o.   MRN: [id number]  HPI  Pt presents to the clinic today with c.o bilateral knee and lower leg pain. She reports this started 1 month ago. She describes the pain as sore and achy. She occasionally has swelling around the knees but not in the lower legs. She denies weakness. She denies any injury to the area. She has tried Tylenol and Aleve with minimal relief.  Of note, her BP is elevated today. She feels like this is a combo of anxiety, pain. She is currently not on any antihypertensive medications. She denies headaches, dizziness, chest pain or shortness of breath.  Review of Systems      Past Medical History:  Diagnosis Date  . Hypertension     Current Outpatient Medications  Medication Sig Dispense Refill  . buPROPion (WELLBUTRIN XL) 150 MG 24 hr tablet TAKE 1 TABLET BY MOUTH DAILY 30 tablet 3  . clonazePAM (KLONOPIN) 1 MG tablet TAKE ONE TABLET BY THREE TIMES A DAY AS NEEDED FOR ANXIETY 90 tablet 0  . etodolac (LODINE) 500 MG tablet TAKE 1 TABLET BY MOUTH TWICE A DAY AS NEEDED. 60 tablet 0  . FLUoxetine (PROZAC) 20 MG capsule TAKE 1 CAPSULE BY MOUTH EVERY DAY. 90 capsule 1  . gabapentin (NEURONTIN) 100 MG capsule TAKE 1 TO 3 CAPSULES BY MOUTH AT BEDTIMEAS NEEDED 90 capsule 1  . rOPINIRole (REQUIP) 1 MG tablet TAKE ONE TABLET BY MOUTH AT BEDTIME. 90 tablet 3   No current facility-administered medications for this visit.     Allergies  Allergen Reactions  . Amlodipine     Ankle swelling  . Hctz [Hydrochlorothiazide]     fatigue  . Influenza Vaccines Other (See Comments)    Reports she became sick following having flu shot with aching and fevers  . Lisinopril   . Losartan     Flu-like symptoms  . Shellfish Allergy     Swelling and hives, just the shell    Family History  Problem Relation Age of Onset  . Hypertension Mother   . Heart disease Father   . Hypertension Father   . Heart  disease Paternal Grandmother   . Heart disease Paternal Grandfather     Social History   Socioeconomic History  . Marital status: Married    Spouse name: Not on file  . Number of children: Not on file  . Years of education: Not on file  . Highest education level: Not on file  Social Needs  . Financial resource strain: Not on file  . Food insecurity - worry: Not on file  . Food insecurity - inability: Not on file  . Transportation needs - medical: Not on file  . Transportation needs - non-medical: Not on file  Occupational History  . Not on file  Tobacco Use  . Smoking status: Never Smoker  . Smokeless tobacco: Never Used  Substance and Sexual Activity  . Alcohol use: Yes    Comment: 1 glass of wine  . Drug use: No  . Sexual activity: Not on file  Other Topics Concern  . Not on file  Social History Narrative   Lives in [place]. Worked as Control and instrumentation engineer.  Married with 27Yo and 21YO children.     Constitutional: Denies fever, malaise, fatigue, headache or abrupt weight changes.  Musculoskeletal: Pt reports bilateral knee and lower leg pain,. Denies decrease in range of motion, difficulty with  gait.    No other specific complaints in a complete review of systems (except as listed in HPI above).  Objective:   Physical Exam  BP (!) 158/100   Pulse (!) 104   Temp 98 F (36.7 C) (Oral)   Wt 212 lb (96.2 kg)   SpO2 98%   BMI 31.31 kg/m  Wt Readings from Last 3 Encounters:  07/09/17 212 lb (96.2 kg)  01/06/17 210 lb 3.2 oz (95.3 kg)  08/18/16 220 lb (99.8 kg)    General: Appears herr stated age, obese in NAD. Musculoskeletal: Crepitus noted witjhROM of bilateral knees. Joint enlargement noted of the right knee. No joint swelling noted. Pain with palpation over lateral joint lines bilaterally. Strength 5/5 BLE. No pain with palpation of the calves. Neurological: Alert and oriented. Sensation intact to BLe.  BMET    Component Value Date/Time   NA 136  01/06/2017 1528   NA 138 08/05/2014 1302   K 4.7 01/06/2017 1528   K 4.9 08/05/2014 1302   CL 101 01/06/2017 1528   CL 104 08/05/2014 1302   CO2 24 01/06/2017 1528   CO2 27 08/05/2014 1302   GLUCOSE 122 (H) 01/06/2017 1528   GLUCOSE 108 (H) 08/05/2014 1302   BUN 15 01/06/2017 1528   BUN 13 08/05/2014 1302   CREATININE 0.90 01/06/2017 1528   CREATININE 0.69 08/05/2014 1302   CALCIUM 9.7 01/06/2017 1528   CALCIUM 9.2 08/05/2014 1302   GFRNONAA >60 08/05/2014 1302   GFRAA >60 08/05/2014 1302    Lipid Panel     Component Value Date/Time   CHOL 238 (H) 03/09/2015 0806   TRIG 116.0 03/09/2015 0806   HDL 54.50 03/09/2015 0806   CHOLHDL 4 03/09/2015 0806   VLDL 23.2 03/09/2015 0806   LDLCALC 160 (H) 03/09/2015 0806    CBC    Component Value Date/Time   WBC 5.2 01/06/2017 1528   RBC 4.31 01/06/2017 1528   HGB 13.6 01/06/2017 1528   HGB 14.3 08/05/2014 1302   HCT 40.9 01/06/2017 1528   HCT 42.0 08/05/2014 1302   PLT 255.0 01/06/2017 1528   PLT 334 08/05/2014 1302   MCV 94.9 01/06/2017 1528   MCV 93 08/05/2014 1302   MCH 31.6 08/05/2014 1302   MCHC 33.3 01/06/2017 1528   RDW 12.7 01/06/2017 1528   RDW 12.9 08/05/2014 1302   LYMPHSABS 1.6 08/18/2016 1207   LYMPHSABS 1.7 08/05/2014 1302   MONOABS 0.5 08/18/2016 1207   MONOABS 0.6 08/05/2014 1302   EOSABS 0.2 08/18/2016 1207   EOSABS 0.3 08/05/2014 1302   BASOSABS 0.0 08/18/2016 1207   BASOSABS 0.1 08/05/2014 1302    Hgb A1C Lab Results  Component Value Date   HGBA1C 5.7 07/25/2014            Assessment & Plan:   Bilateral Knee Pain:  Likely arthritis Xray bilateral knees today Encouraged her to continue Tylenol or Aleve Knee exercises given  Myalgias:  Will check CK, ESR, RF, ANA, CRP Not on a statin  HTN:  Advised her to make an appt with her PCP to discuss this  Return precautions discussed Webb Silversmith, NP

## 2017-07-16 NOTE — Telephone Encounter (Signed)
Pt calling stating she received mychart msg and results but she doesn't understand what any of them mean and has questions. Saw Nicki Reaperegina Baity, NP 07/09/17.  Call back [phone]

## 2017-07-16 NOTE — Telephone Encounter (Signed)
Spoke to pt and advised/discussed results. States she will contact office back with decision regarding rheumatology referral

## 2017-07-19 ENCOUNTER — Encounter: Payer: Self-pay | Admitting: Family Medicine

## 2017-07-20 ENCOUNTER — Encounter: Payer: Self-pay | Admitting: Internal Medicine

## 2017-07-20 ENCOUNTER — Other Ambulatory Visit: Payer: Self-pay | Admitting: Internal Medicine

## 2017-07-20 DIAGNOSIS — M79605 Pain in left leg: Secondary | ICD-10-CM

## 2017-07-20 DIAGNOSIS — M79604 Pain in right leg: Secondary | ICD-10-CM

## 2017-07-20 DIAGNOSIS — R768 Other specified abnormal immunological findings in serum: Secondary | ICD-10-CM

## 2017-07-20 NOTE — Progress Notes (Signed)
amb ref °

## 2017-07-21 ENCOUNTER — Telehealth: Payer: Self-pay | Admitting: Family Medicine

## 2017-07-21 ENCOUNTER — Encounter: Payer: Self-pay | Admitting: Family Medicine

## 2017-07-21 ENCOUNTER — Other Ambulatory Visit: Payer: Self-pay | Admitting: Family Medicine

## 2017-07-21 DIAGNOSIS — R768 Other specified abnormal immunological findings in serum: Secondary | ICD-10-CM

## 2017-07-21 DIAGNOSIS — Z5181 Encounter for therapeutic drug level monitoring: Secondary | ICD-10-CM

## 2017-07-21 NOTE — Telephone Encounter (Signed)
Refilled in another encounter.

## 2017-07-21 NOTE — Telephone Encounter (Signed)
Copied from CRM [id number]. Topic: Quick Communication - See Telephone Encounter >> Jul 21, 2017  1:13 PM Windy KalataMichael, Weaver Tweed L, NT wrote: CRM for notification. See Telephone encounter for: 07/21/17.  Patient is needing a refill on gabapentin (NEURONTIN) 100 MG capsule Please advise.  [facility] [address] [place], [place] - [address]  [address] [place] [address]  Phone: [phone] Fax: [phone]

## 2017-07-22 NOTE — Telephone Encounter (Signed)
Please call the patient to get her set up for a lab appointment this week or early next week. Thanks.

## 2017-07-22 NOTE — Addendum Note (Signed)
Addended by: Glori LuisSONNENBERG, ERIC G on: 07/22/2017 07:18 PM   Modules accepted: Orders

## 2017-07-23 NOTE — Telephone Encounter (Signed)
Patient is scheduled   

## 2017-07-24 ENCOUNTER — Telehealth: Payer: Self-pay | Admitting: Family Medicine

## 2017-07-24 NOTE — Telephone Encounter (Signed)
Patient states that her legs are bothering her more than normal and she takes gabapentin and requip. She says she is not sure if her restless legs is getting worse but she would like something for the pain.

## 2017-07-24 NOTE — Telephone Encounter (Signed)
Copied from CRM [id number]. Topic: Inquiry >> Jul 24, 2017 11:41 AM Alexander BergeronBarksdale, Harvey B wrote: Reason for CRM: pt called to ask if Dr. Birdie SonsSonnenberg can call in a medication for her leg pain, pt states she is unable to get an appt @ [facility] b/c she does not have insurance and they're going thru a financial assist program for her, contact pt to advise on her pain management and possible medication treatment

## 2017-07-25 NOTE — Telephone Encounter (Signed)
My chart message sent the patient.  Please see if she got it. Please offer her an appointment in the office this coming week.  Thanks.

## 2017-07-27 ENCOUNTER — Other Ambulatory Visit: Payer: Self-pay

## 2017-07-27 NOTE — Telephone Encounter (Signed)
If she has continued to have discomfort she should be seen sooner than next week. Please offer her an appointment this week if possible. Thanks.

## 2017-07-27 NOTE — Telephone Encounter (Signed)
Patient has lab appointment 07/30/17 and appointment with PCP on 08/05/17 this ok? My chart message read by patient.

## 2017-07-28 ENCOUNTER — Encounter: Payer: Self-pay | Admitting: Family Medicine

## 2017-07-30 ENCOUNTER — Other Ambulatory Visit: Payer: Self-pay

## 2017-08-03 ENCOUNTER — Encounter: Payer: Self-pay | Admitting: Family Medicine

## 2017-08-05 ENCOUNTER — Encounter: Payer: Self-pay | Admitting: Family Medicine

## 2017-08-05 ENCOUNTER — Ambulatory Visit (INDEPENDENT_AMBULATORY_CARE_PROVIDER_SITE_OTHER): Payer: Self-pay | Admitting: Family Medicine

## 2017-08-05 ENCOUNTER — Other Ambulatory Visit: Payer: Self-pay

## 2017-08-05 VITALS — BP 170/110 | HR 74 | Temp 98.1°F | Ht 69.0 in | Wt 214.0 lb

## 2017-08-05 DIAGNOSIS — M79604 Pain in right leg: Secondary | ICD-10-CM

## 2017-08-05 DIAGNOSIS — M79605 Pain in left leg: Secondary | ICD-10-CM

## 2017-08-05 DIAGNOSIS — T148XXA Other injury of unspecified body region, initial encounter: Secondary | ICD-10-CM

## 2017-08-05 DIAGNOSIS — I10 Essential (primary) hypertension: Secondary | ICD-10-CM

## 2017-08-05 NOTE — Assessment & Plan Note (Signed)
Slight increase in bruising recently.  Potentially related injuries.  We will check a CBC to evaluate platelets and WBC.

## 2017-08-05 NOTE — Assessment & Plan Note (Signed)
Not at goal.  She has allergies or intolerances to amlodipine, ACE inhibitors, ARB's, hydrochlorothiazide, beta-blockers, and clonidine.  We will discuss with our clinical pharmacist to determine the next best option for her.

## 2017-08-05 NOTE — Assessment & Plan Note (Signed)
Potentially related to muscular strain following falling over.  Could be a rheumatologic issue.  Rheumatologic workup negative with the exception of minimally elevated rheumatoid factor.  Discussed referral to sports medicine to consider ultrasound of the musculature where she is most tender.  Given return precautions.

## 2017-08-05 NOTE — Progress Notes (Signed)
  Elizabeth Rumps, MD Phone: [phone]  Elizabeth Dunn is a 57 y.o. female who presents today for f/u.  Recently seen for bilateral leg pain at 1 of our other offices.  Rheumatologic workup revealed slightly elevated rheumatoid factor.  Bilateral leg pain started couple of days after falling over in the mind.  No specific injury at that time.  Hurts in her bilateral hamstrings and calves.  Hurts the more she stands.  Hurts with flexing her legs.  Does not improve with rest.  No exertional discomfort.  No unilateral swelling.    She does note she is bruising slightly easier.  Was bruising on her hands and arms easier previously had a little bit of bruising on her legs.  No itching or weight loss.  She has tried Aleve and ibuprofen as well as tramadol from her mother with little benefit.  Hypertension: Not taking any medications.  Not checking at home.  No chest pain or shortness of breath.  Social History   Tobacco Use  Smoking Status Never Smoker  Smokeless Tobacco Never Used     ROS see history of present illness  Objective  Physical Exam Vitals:   08/05/17 1605  BP: (!) 170/110  Pulse: 74  Temp: 98.1 F (36.7 C)  SpO2: 95%    BP Readings from Last 3 Encounters:  08/05/17 (!) 170/110  07/09/17 (!) 158/100  01/14/17 (!) 180/106   Wt Readings from Last 3 Encounters:  08/05/17 214 lb (97.1 kg)  07/09/17 212 lb (96.2 kg)  01/06/17 210 lb 3.2 oz (95.3 kg)    Physical Exam  Constitutional: No distress.  Cardiovascular: Normal rate, regular rhythm and normal heart sounds.  Pulmonary/Chest: Effort normal and breath sounds normal.  Musculoskeletal: She exhibits no edema.  Slight muscular soreness on palpation in bilateral lower extremities mostly in the posterior aspect of her legs, tender over the hamstring insertion site at her left leg medially as well as right leg medially, 5/5 strength bilateral quads, hamstrings, plantar flexion, and dorsiflexion, sensation light  touch intact bilateral lower extremities, 2+ DP and PT pulses  Neurological: She is alert.  Skin: Skin is warm and dry. She is not diaphoretic.  Slight bruise over left posterior calf   Assessment/Plan: Please see individual problem list.  Bruising Slight increase in bruising recently.  Potentially related injuries.  We will check a CBC to evaluate platelets and WBC.  Bilateral leg pain Potentially related to muscular strain following falling over.  Could be a rheumatologic issue.  Rheumatologic workup negative with the exception of minimally elevated rheumatoid factor.  Discussed referral to sports medicine to consider ultrasound of the musculature where she is most tender.  Given return precautions.  Hypertension Not at goal.  She has allergies or intolerances to amlodipine, ACE inhibitors, ARB's, hydrochlorothiazide, beta-blockers, and clonidine.  We will discuss with our clinical pharmacist to determine the next best option for her.   Orders Placed This Encounter  Procedures  . CBC w/Diff  . Comp Met (CMET)  . Ambulatory referral to Sports Medicine    Referral Priority:   Routine    Referral Type:   Consultation    Number of Visits Requested:   1    No orders of the defined types were placed in this encounter.    Elizabeth Rumps, MD [place]

## 2017-08-05 NOTE — Patient Instructions (Signed)
Nice to see you. We will get lab work today and contact you with the results. We will talk with our clinical pharmacist regarding your blood pressure. Will check into financial assistance to get you to see sports medicine.

## 2017-08-06 LAB — CBC WITH DIFFERENTIAL/PLATELET
BASOS ABS: 0.1 10*3/uL (ref 0.0–0.1)
BASOS PCT: 1.6 % (ref 0.0–3.0)
EOS PCT: 2 % (ref 0.0–5.0)
Eosinophils Absolute: 0.2 10*3/uL (ref 0.0–0.7)
HEMATOCRIT: 44.3 % (ref 36.0–46.0)
Hemoglobin: 15.2 g/dL — ABNORMAL HIGH (ref 12.0–15.0)
LYMPHS ABS: 3 10*3/uL (ref 0.7–4.0)
Lymphocytes Relative: 36.2 % (ref 12.0–46.0)
MCHC: 34.2 g/dL (ref 30.0–36.0)
MCV: 94 fl (ref 78.0–100.0)
MONOS PCT: 7.1 % (ref 3.0–12.0)
Monocytes Absolute: 0.6 10*3/uL (ref 0.1–1.0)
NEUTROS ABS: 4.5 10*3/uL (ref 1.4–7.7)
NEUTROS PCT: 53.1 % (ref 43.0–77.0)
PLATELETS: 376 10*3/uL (ref 150.0–400.0)
RBC: 4.72 Mil/uL (ref 3.87–5.11)
RDW: 12.4 % (ref 11.5–15.5)
WBC: 8.4 10*3/uL (ref 4.0–10.5)

## 2017-08-06 LAB — COMPREHENSIVE METABOLIC PANEL
ALT: 30 U/L (ref 0–35)
AST: 22 U/L (ref 0–37)
Albumin: 4.4 g/dL (ref 3.5–5.2)
Alkaline Phosphatase: 79 U/L (ref 39–117)
BILIRUBIN TOTAL: 0.5 mg/dL (ref 0.2–1.2)
BUN: 18 mg/dL (ref 6–23)
CO2: 27 meq/L (ref 19–32)
CREATININE: 0.82 mg/dL (ref 0.40–1.20)
Calcium: 9.5 mg/dL (ref 8.4–10.5)
Chloride: 100 mEq/L (ref 96–112)
GFR: 76.45 mL/min (ref >60.00)
Glucose, Bld: 98 mg/dL (ref 70–99)
Potassium: 4.4 mEq/L (ref 3.5–5.1)
SODIUM: 136 meq/L (ref 135–145)
TOTAL PROTEIN: 7.9 g/dL (ref 6.0–8.3)

## 2017-08-07 ENCOUNTER — Telehealth: Payer: Self-pay | Admitting: Family Medicine

## 2017-08-07 ENCOUNTER — Encounter: Payer: Self-pay | Admitting: Family Medicine

## 2017-08-07 NOTE — Telephone Encounter (Signed)
Pt notified of lab results

## 2017-08-07 NOTE — Telephone Encounter (Signed)
Copied from CRM [phone]. Topic: Quick Communication - Lab Results >> Aug 07, 2017  1:24 PM Crist InfanteHarrald, Kathy J wrote: Pt would like a call to go over labs from 4/10

## 2017-08-08 ENCOUNTER — Telehealth: Payer: Self-pay | Admitting: Family Medicine

## 2017-08-08 DIAGNOSIS — I10 Essential (primary) hypertension: Secondary | ICD-10-CM

## 2017-08-08 MED ORDER — CLONAZEPAM 1 MG PO TABS: ORAL_TABLET | ORAL | 0 refills | Status: DC

## 2017-08-08 MED ORDER — FLUOXETINE HCL 20 MG PO CAPS: ORAL_CAPSULE | ORAL | 1 refills | Status: DC

## 2017-08-08 NOTE — Telephone Encounter (Signed)
Please let the patient know that I heard back from the clinical pharmacist regarding her BP meds. She recommended trying HCTZ again given that her intolerance was fatigue or trying something in the class of losartan given the intolerance is listed as flu like symptoms. She may have fatigue initially with any of these medications to start with as her body adjusts to a lower BP. It will take time for her to adjust to the lower BP. She really needs to be on medication as the uncontrolled BP is placing her at risk for stroke and heart attack. Please see if she is willing to start one of these medications. Thanks.

## 2017-08-08 NOTE — Telephone Encounter (Signed)
-----   Message from Allena KatzCaroline E Welles, [facility] sent at 08/07/2017 10:33 AM EDT ----- I'd be willing to retry any of the ones that caused "fatigue" (looks like HCTZ at least) and counsel her on this as fatigue is normal until her body adjusts to new lower BP. Would also be willing to try a different ARB. Then, yes - would attempt spiro first.  ----- Message ----- From: Glori LuisSonnenberg, Mervil Wacker G, MD Sent: 08/05/2017   6:38 PM To: Allena Katzaroline E Welles, RPH  Hi Rayfield Citizenaroline,   This patient has very uncontrolled BPs and has been intolerant of ACE inhibitors, ARB's, amlodipine, HCTZ, and beta-blockers as well as clonidine.  Would the next best option would be spironolactone or something like hydralazine?  Let me know what you think.  Thanks.  Sokhna Christoph.

## 2017-08-10 ENCOUNTER — Telehealth: Payer: Self-pay

## 2017-08-10 MED ORDER — HYDROCHLOROTHIAZIDE 12.5 MG PO TABS: 12.5000 mg | ORAL_TABLET | Freq: Every day | ORAL | 1 refills | Status: DC

## 2017-08-10 NOTE — Telephone Encounter (Signed)
HCTZ sent to pharmacy.  She will need a BP check with nursing and lab work in 1 month.  Please get her scheduled for this.  Orders have been placed.  Thanks.

## 2017-08-10 NOTE — Telephone Encounter (Signed)
Patient returned call and I informed her of Dr. Kermit BaloSonnenbergs recommendation for her BP and states that she willing to try a HCTZ.

## 2017-08-10 NOTE — Telephone Encounter (Signed)
Left voicemail for patient to call office back about her blood pressure

## 2017-08-10 NOTE — Addendum Note (Signed)
Addended by: Glori LuisSONNENBERG, Essense Bousquet G on: 08/10/2017 04:21 PM   Modules accepted: Orders

## 2017-08-10 NOTE — Telephone Encounter (Signed)
Called and left voicemail for patient asking her to give the office a call back about her BP.

## 2017-08-11 NOTE — Telephone Encounter (Signed)
Patient has been notified that HCTZ has been sent to pharmacy and scheduled for nurse visit for BP check and scheduled for lab in 1 month.

## 2017-08-11 NOTE — Telephone Encounter (Signed)
Left voicemail for patient that HCTZ was sent to pharmacy and that Dr. Birdie SonsSonnenberg would like for her to come back to the office for BP check and labs and to call the office back to get that scheduled.

## 2017-08-13 ENCOUNTER — Ambulatory Visit: Payer: Self-pay | Admitting: Sports Medicine

## 2017-08-17 ENCOUNTER — Other Ambulatory Visit: Payer: Self-pay

## 2017-08-17 MED ORDER — HYDROCHLOROTHIAZIDE 12.5 MG PO TABS: 12.5000 mg | ORAL_TABLET | Freq: Every day | ORAL | 1 refills | Status: DC

## 2017-08-17 NOTE — Telephone Encounter (Signed)
Pt called and states that the pharmacy does not have the medication of hydrochlorothiazide (HYDRODIURIL) 12.5 MG tablet [[id number]][[id number]]  Please send over for pharmacy to fill, call pt or pharmacy if needed

## 2017-08-17 NOTE — Telephone Encounter (Signed)
rx [facility] to [facility]

## 2017-08-18 ENCOUNTER — Encounter: Payer: Self-pay | Admitting: Family Medicine

## 2017-08-19 ENCOUNTER — Ambulatory Visit: Payer: Self-pay | Admitting: Sports Medicine

## 2017-09-01 ENCOUNTER — Other Ambulatory Visit: Payer: Self-pay | Admitting: Family Medicine

## 2017-09-01 NOTE — Telephone Encounter (Signed)
Last OV 08/05/17 last filled 08/08/17 90 0rf

## 2017-09-01 NOTE — Telephone Encounter (Signed)
Please see my my chart message to the patient from 08/07/2017.  There are questions listed within that message that I would like answers to prior to refilling this medication for more than several days.  I sent in a 5-day refill.  Please contact her to try to get these answers.  Thank you.

## 2017-09-02 ENCOUNTER — Encounter: Payer: Self-pay | Admitting: Family Medicine

## 2017-09-02 NOTE — Telephone Encounter (Signed)
You are welcome. Hope you are having a good weekend. I will send in a short term refill of the klonopin. I will refill the prozac. In the future please request the refill of your klonopin when you are in the office. You will need to have consistent follow-up every 3 months given that you are on this medication and it is a controlled substance. Please contact us to set up a 3 month follow-up. I have to advise that if you do not follow-up consistently I will not be able to provide long term refills on the klonopin. Please also answer the following questions. How is your anxiety? Has it worsened or gotten better? What makes you anxious? How often do you take the klonopin? Does it make you drowsy? If you drink alcohol please do not drink alcohol when you take the klonopin. I do not think the lodine is a great option until we get your BP under control. Someone will contact you regarding BP options this coming week.   Patient notified and states her anxiety varies, she states she has a lot going on and someday's are better then others. Patient states it does not make her drowsy.she takes the klonopin 3 times a day sometimes twice.

## 2017-09-08 ENCOUNTER — Ambulatory Visit: Payer: Self-pay | Admitting: Sports Medicine

## 2017-09-10 ENCOUNTER — Other Ambulatory Visit: Payer: Self-pay

## 2017-09-10 ENCOUNTER — Ambulatory Visit: Payer: Self-pay

## 2017-09-15 ENCOUNTER — Encounter: Payer: Self-pay | Admitting: Sports Medicine

## 2017-09-15 ENCOUNTER — Ambulatory Visit: Payer: Self-pay | Admitting: Sports Medicine

## 2017-09-15 ENCOUNTER — Ambulatory Visit: Payer: Self-pay

## 2017-09-15 VITALS — BP 172/92 | HR 79 | Ht 69.0 in | Wt 218.2 lb

## 2017-09-15 DIAGNOSIS — M25562 Pain in left knee: Secondary | ICD-10-CM

## 2017-09-15 DIAGNOSIS — M25462 Effusion, left knee: Principal | ICD-10-CM

## 2017-09-15 DIAGNOSIS — M25461 Effusion, right knee: Secondary | ICD-10-CM

## 2017-09-15 DIAGNOSIS — I890 Lymphedema, not elsewhere classified: Secondary | ICD-10-CM

## 2017-09-15 DIAGNOSIS — M79604 Pain in right leg: Secondary | ICD-10-CM

## 2017-09-15 DIAGNOSIS — M25561 Pain in right knee: Secondary | ICD-10-CM

## 2017-09-15 DIAGNOSIS — R768 Other specified abnormal immunological findings in serum: Secondary | ICD-10-CM

## 2017-09-15 DIAGNOSIS — M79605 Pain in left leg: Secondary | ICD-10-CM

## 2017-09-15 NOTE — Progress Notes (Signed)
[facility]. Delorise Shiner Sports [facility] at [facility] [phone]  SVARA TWYMAN - 57 y.o. female MRN [id number]  Date of birth: [birth year]  Visit Date: 09/15/2017  PCP: Glori Luis, MD   Referred by: Glori Luis, MD  Scribe for today's visit: Stevenson Clinch, CMA     SUBJECTIVE:  Elizabeth Dunn is here for Initial Assessment (bilateral leg pain) .  Referred by: Dr. Marikay Alar Her bilateral leg pain symptoms INITIALLY: Began 05/2017, no known injury or trauma.  Described as moderate-severe aching, radiating up and down both legs. At time she has a stabbing pain in her legs.  Worsened with standing, flexion She gets some relief when sleeping with a pillow between her knees.  Additional associated symptoms include: She has elevated rheumatoid factor with PCP. Pain is mostly in calves and hamstrings. She has noticed that she is bruising very easily. She c/o tenderness to palpation on the medial aspect of both knees. She has noticed popping in both knees. She noticed occasional swelling in her legs.     At this time symptoms are improving compared to onset but continue to be moderate and at times severe.  She has tried taking Naproxen with minimal relief. She gets some relief with IBU but it causes her BP to go up. She gets minimal relief with Tylenol. She was taking Gabapentin and Requip but she felt that they were causing weight gain and increasing her anxiety so she d/c these medications. She was taking Etodolac in the past but PCP did not refill this for her so she hasn't been taking it.   ROS Reports night time disturbances. Denies fevers, chills, or night sweats. Denies unexplained weight loss. Reports personal history of cancer, skin cancer. Denies changes in bowel or bladder habits. Denies recent unreported falls. Reports new or worsening dyspnea or wheezing. Denies headaches or dizziness.  Reports numbness, tingling or  weakness  In the extremities.  Denies dizziness or presyncopal episodes Reports lower extremity edema    HISTORY & PERTINENT PRIOR DATA:  Prior History reviewed and updated per electronic medical record.  Significant/pertinent history, findings, studies include:  reports that she has never smoked. She has never used smokeless tobacco. No results for input(s): HGBA1C, LABURIC, CREATINE in the last 8760 hours. No specialty comments available. No problems updated.  OBJECTIVE:  VS:  HT:5\' 9"  (175.3 cm)   WT:218 lb 3.2 oz (99 kg)  BMI:32.21    BP:(Abnormal) 172/92  HR:79bpm  TEMP: ( )  RESP:96 %   PHYSICAL EXAM: Constitutional: WDWN, Non-toxic appearing. Psychiatric: Alert & appropriately interactive.  Not depressed or anxious appearing. Respiratory: No increased work of breathing.  Trachea Midline Eyes: Pupils are equal.  EOM intact without nystagmus.  No scleral icterus  Vascular Exam: warm to touch no edema  lower extremity neuro exam: unremarkable normal strength normal sensation  MSK Exam: Bilateral legs with generalized mild swelling around both knees but no significant effusions.  No significant overlying skin changes.   ASSESSMENT & PLAN:   1. Effusion of both knee joints   2. Elevated rheumatoid factor   3. Acute pain of both knees   4. Bilateral leg pain   5. Lymphedema of left arm     PLAN: Patient does have fairly interesting systemic features including with edema of her left arm and bilateral leg pain.  She did have trace effusions on MSK ultrasound and if any lack of improvement could consider diagnostic  arthrocentesis.  Consider repeating blood work including checking an anti-CCP for concerns for potential rheumatoid arthritis.  Otherwise if persistent ongoing symptoms and any lack of improvement can consider rheumatologic referral but at this point she will call if any lack of improvement given the hour drive it takes her to come to see me.  I am happy to  see her back at any point though.  Follow-up: Return if symptoms worsen or fail to improve.     Please see additional documentation for Objective, Assessment and Plan sections. Pertinent additional documentation may be included in corresponding procedure notes, imaging studies, problem based documentation and patient instructions. Please see these sections of the encounter for additional information regarding this visit.  CMA/ATC served as Neurosurgeon during this visit. History, Physical, and Plan performed by medical provider. Documentation and orders reviewed and attested to.      Andrena Mews, DO     Sports Medicine Physician

## 2017-09-15 NOTE — Patient Instructions (Addendum)
You had an injection today.  Things to be aware of after injection are listed below: . You may experience no significant improvement or even a slight worsening in your symptoms during the first 24 to 48 hours.  After that we expect your symptoms to improve gradually over the next 2 weeks for the medicine to have its maximal effect.  You should continue to have improvement out to 6 weeks after your injection. . Dr. Berline Chough recommends icing the site of the injection for 20 minutes  1-2 times the day of your injection . You may shower but no swimming, tub bath or Jacuzzi for 24 hours. . If your bandage falls off this does not need to be replaced.  It is appropriate to remove the bandage after 4 hours. . You may resume light activities as tolerated unless otherwise directed per Dr. Berline Chough during your visit  POSSIBLE STEROID SIDE EFFECTS:  Side effects from injectable steroids tend to be less than when taken orally however you may experience some of the symptoms listed below.  If experienced these should only last for a short period of time. Change in menstrual flow  Edema (swelling)  Increased appetite Skin flushing (redness)  Skin rash/acne  Thrush (oral) Yeast vaginitis    Increased sweating  Depression Increased blood glucose levels Cramping and leg/calf  Euphoria (feeling happy)  POSSIBLE PROCEDURE SIDE EFFECTS: The side effects of the injection are usually fairly minimal however if you may experience some of the following side effects that are usually self-limited and will is off on their own.  If you are concerned please feel free to call the office with questions:  Increased numbness or tingling  Nausea or vomiting  Swelling or bruising at the injection site   Please call our office if if you experience any of the following symptoms over the next week as these can be signs of infection:   Fever greater than 100.63F  Significant swelling at the injection site  Significant redness or drainage  from the injection site  If after 2 weeks you are continuing to have worsening symptoms please call our office to discuss what the next appropriate actions should be including the potential for a return office visit or other diagnostic testing.     I recommend you obtained a compression sleeve to help with your joint problems. There are many options on the market however I recommend obtaining a KNEE Body Helix compression sleeve.  You can find information (including how to appropriate measure yourself for sizing) can be found at www.[employer].ch.  Many of these products are health savings account (HSA) eligible.   You can use the compression sleeve at any time throughout the day but is most important to use while being active as well as for 2 hours post-activity.   It is appropriate to ice following activity with the compression sleeve in place.

## 2017-09-15 NOTE — Procedures (Signed)
PROCEDURE NOTE:  Ultrasound Guided: Injection: Bilateral knee Images were obtained and interpreted by myself, Gaspar Bidding, DO  Images have been saved and stored to PACS system. Images obtained on: GE S7 Ultrasound machine    ULTRASOUND FINDINGS:  small effusion on right and small to moderate on left. fairly advanced patellofemoral spurring   DESCRIPTION OF PROCEDURE:  The patient's clinical condition is marked by substantial pain and/or significant functional disability. Other conservative therapy has not provided relief, is contraindicated, or not appropriate. There is a reasonable likelihood that injection will significantly improve the patient's pain and/or functional impairment.   After discussing the risks, benefits and expected outcomes of the injection and all questions were reviewed and answered, the patient wished to undergo the above named procedure.  Verbal consent was obtained.  The ultrasound was used to identify the target structure and adjacent neurovascular structures. The skin was then prepped in sterile fashion and the target structure was injected under direct visualization using sterile technique as below:  Identical procedures performed as below to both the right and left knee.  PREP: Alcohol and Ethel Chloride APPROACH: superiolateral, single injection, 25g 1.5 in. INJECTATE: 2 cc 0.5% Marcaine and 1 cc /mL DepoMedrol ASPIRATE: None DRESSING: Band-Aid  Post procedural instructions including recommending icing and warning signs for infection were reviewed.    This procedure was well tolerated and there were no complications.   IMPRESSION: Succesful Ultrasound Guided: Injection

## 2017-09-27 ENCOUNTER — Encounter: Payer: Self-pay | Admitting: Sports Medicine

## 2017-09-29 ENCOUNTER — Encounter: Payer: Self-pay | Admitting: Sports Medicine

## 2017-10-07 ENCOUNTER — Encounter: Payer: Self-pay | Admitting: Family Medicine

## 2017-10-07 ENCOUNTER — Ambulatory Visit (INDEPENDENT_AMBULATORY_CARE_PROVIDER_SITE_OTHER): Payer: Self-pay | Admitting: Family Medicine

## 2017-10-07 VITALS — BP 178/94 | HR 80 | Temp 98.3°F | Ht 69.0 in | Wt 217.6 lb

## 2017-10-07 DIAGNOSIS — M79605 Pain in left leg: Secondary | ICD-10-CM

## 2017-10-07 DIAGNOSIS — F419 Anxiety disorder, unspecified: Secondary | ICD-10-CM

## 2017-10-07 DIAGNOSIS — M79604 Pain in right leg: Secondary | ICD-10-CM

## 2017-10-07 DIAGNOSIS — B349 Viral infection, unspecified: Secondary | ICD-10-CM

## 2017-10-07 DIAGNOSIS — I10 Essential (primary) hypertension: Secondary | ICD-10-CM

## 2017-10-07 MED ORDER — CLONAZEPAM 1 MG PO TABS: ORAL_TABLET | ORAL | 0 refills | Status: DC

## 2017-10-07 NOTE — Progress Notes (Signed)
Marikay AlarEric Sonnenberg, MD Phone: [phone]  Elizabeth LemonKaren A Dunn is a 57 y.o. female who presents today for f/u.  CC: htn, HA, leg pain  HYPERTENSION  Disease Monitoring  Home BP Monitoring 147/87 yesterday, typically similar to today Chest pain- no    Dyspnea- no Medications  Compliance-  Taking atenolol.  Edema- no  Reports starting last week she had some nausea and some achiness.  She did not throw up.  No diarrhea.  She had some sore throat and postnasal drip.  Some headache this morning.  No vision changes, numbness, or weakness.  Notes the headache has eased off with ibuprofen.  She saw sports medicine for the pain in her legs.  She got knee injections.  She notes the pain is very minimal at this time.  Anxiety: She notes this is relatively well-controlled.  She does not feel depressed.  No thoughts of harming herself.  She needs a refill on klonopin.    Social History   Tobacco Use  Smoking Status Never Smoker  Smokeless Tobacco Never Used     ROS see history of present illness  Objective  Physical Exam Vitals:   10/07/17 1608  BP: (!) 178/94  Pulse: 80  Temp: 98.3 F (36.8 C)  SpO2: 96%    BP Readings from Last 3 Encounters:  10/07/17 (!) 178/94  09/15/17 (!) 172/92  08/05/17 (!) 170/110   Wt Readings from Last 3 Encounters:  10/07/17 217 lb 9.6 oz (98.7 kg)  09/15/17 218 lb 3.2 oz (99 kg)  08/05/17 214 lb (97.1 kg)    Physical Exam  Constitutional: No distress.  HENT:  Mouth/Throat: Oropharynx is clear and moist. No oropharyngeal exudate.  Eyes: Pupils are equal, round, and reactive to light. Conjunctivae are normal.  Cardiovascular: Normal rate, regular rhythm and normal heart sounds.  Pulmonary/Chest: Effort normal and breath sounds normal.  Musculoskeletal: She exhibits no edema.  Bilateral knees with no swelling, warmth, erythema, or tenderness  Neurological: She is alert.  CN 2-12 intact, 5/5 strength in bilateral biceps, triceps, grip, quads,  hamstrings, plantar and dorsiflexion, sensation to light touch intact in bilateral UE and LE, normal gait  Skin: Skin is warm and dry. She is not diaphoretic.     Assessment/Plan: Please see individual problem list.  Hypertension Uncontrolled.  I would like to place her on a different ARB and she has been on previously.  We will check lab work today and then send that in.  Discussed that it is very important that she return within 7 to 10 days for additional lab work after starting this medication.  If she is not going to be able to comply with that an ARB or spironolactone would not be a great option for her.  I discussed the risks of ARB's with kidney function and potassium.  Return in 1 week for BP check.  Bilateral leg pain Responded to injections through sports medicine.  She will monitor for recurrence.  Anxiety Seems to be adequately controlled.  Refill clonazepam.  Drug database reviewed.  Viral illness Suspect she has a viral upper respiratory illness with her postnasal drip and headache related to this.  She is neurologically intact.  Discussed that this should improve on its own with time.  If not improving she will let us know.  Given return precautions.   Orders Placed This Encounter  Procedures  . Basic Metabolic Panel (BMET)  . Basic Metabolic Panel (BMET)    Standing Status:   Future  Standing Expiration Date:   10/08/2018    Meds ordered this encounter  Medications  . clonazePAM (KLONOPIN) 1 MG tablet    Sig: TAKE ONE TABLET BY MOUTH THREE TIMES A DAY AS NEEDED FOR ANXIETY.    Dispense:  90 tablet    Refill:  0     Marikay Alar, MD [facility]

## 2017-10-07 NOTE — Assessment & Plan Note (Signed)
Suspect she has a viral upper respiratory illness with her postnasal drip and headache related to this.  She is neurologically intact.  Discussed that this should improve on its own with time.  If not improving she will let us know.  Given return precautions.

## 2017-10-07 NOTE — Assessment & Plan Note (Signed)
Seems to be adequately controlled.  Refill clonazepam.  Drug database reviewed.

## 2017-10-07 NOTE — Assessment & Plan Note (Addendum)
Uncontrolled.  I would like to place her on a different ARB and she has been on previously.  We will check lab work today and then send that in.  Discussed that it is very important that she return within 7 to 10 days for additional lab work after starting this medication.  If she is not going to be able to comply with that an ARB or spironolactone would not be a great option for her.  I discussed the risks of ARB's with kidney function and potassium.  Return in 1 week for BP check.

## 2017-10-07 NOTE — Assessment & Plan Note (Signed)
Responded to injections through sports medicine.  She will monitor for recurrence.

## 2017-10-07 NOTE — Patient Instructions (Signed)
Nice to see you. We will check lab work today and then send blood pressure medication in for you. You will have to return in 7 to 10 days for recheck of labs and blood pressure with nursing. You likely have a viral illness.  This should improve with time.  If it does not please let us know.  If your headache worsens or you develop vision changes, numbness, or weakness please be evaluated immediately.

## 2017-10-08 ENCOUNTER — Encounter: Payer: Self-pay | Admitting: Family Medicine

## 2017-10-08 ENCOUNTER — Other Ambulatory Visit: Payer: Self-pay | Admitting: Family Medicine

## 2017-10-08 LAB — BASIC METABOLIC PANEL
BUN: 17 mg/dL (ref 6–23)
CO2: 25 meq/L (ref 19–32)
Calcium: 9.1 mg/dL (ref 8.4–10.5)
Chloride: 102 mEq/L (ref 96–112)
Creatinine, Ser: 0.79 mg/dL (ref 0.40–1.20)
GFR: 79.76 mL/min (ref >60.00)
GLUCOSE: 104 mg/dL — AB (ref 70–99)
POTASSIUM: 4.6 meq/L (ref 3.5–5.1)
SODIUM: 136 meq/L (ref 135–145)

## 2017-10-08 MED ORDER — OLMESARTAN MEDOXOMIL 20 MG PO TABS: 20.0000 mg | ORAL_TABLET | Freq: Every day | ORAL | 1 refills | Status: DC

## 2017-10-09 MED ORDER — AMOXICILLIN-POT CLAVULANATE 875-125 MG PO TABS: 1.0000 | ORAL_TABLET | Freq: Two times a day (BID) | ORAL | 0 refills | Status: DC

## 2017-10-14 NOTE — Telephone Encounter (Signed)
Was rescheduling nurse visit for tomorrow due to PCP not in office, patient says the Benicar is making her heart race stated last night pulse ws 110 patient asking if she can go back to atenolol?

## 2017-10-15 ENCOUNTER — Other Ambulatory Visit: Payer: Self-pay

## 2017-10-15 ENCOUNTER — Other Ambulatory Visit: Payer: Self-pay | Admitting: Family Medicine

## 2017-10-15 ENCOUNTER — Encounter: Payer: Self-pay | Admitting: Family Medicine

## 2017-10-15 ENCOUNTER — Ambulatory Visit: Payer: Self-pay

## 2017-10-15 MED ORDER — ATENOLOL 50 MG PO TABS
50.0000 mg | ORAL_TABLET | Freq: Every day | ORAL | 1 refills | Status: DC
Start: 2017-10-15 — End: 2017-12-14

## 2017-10-15 NOTE — Telephone Encounter (Signed)
I am fine with her going back on atenolol at 50 mg by mouth daily to start. It looks like this was already sent in as the wrong dose of 100 mg daily. Please call the pharmacy and change the dosing to atenolol 50 mg tablets with the directions take 50 mg (1 tablet) by mouth daily.  Thanks.

## 2017-10-19 ENCOUNTER — Ambulatory Visit: Payer: Self-pay

## 2017-10-19 ENCOUNTER — Other Ambulatory Visit: Payer: Self-pay

## 2017-11-05 ENCOUNTER — Telehealth: Payer: Self-pay | Admitting: Family Medicine

## 2017-11-05 NOTE — Telephone Encounter (Unsigned)
Copied from CRM [phone]. Topic: Quick Communication - See Telephone Encounter >> Nov 05, 2017  2:43 PM Raquel SarnaHayes, Teresa G wrote: Pt's son is soon to have a baby to arrive July 22.  Pt is being told she has to get the T[facility] shot [facility]. Please call pt back asap to discuss next step.

## 2017-11-06 NOTE — Telephone Encounter (Signed)
Okay for tdap. No records of her having

## 2017-11-06 NOTE — Telephone Encounter (Signed)
Left message to return call, ok for pec to schedule nurse visit for tdap for patient

## 2017-11-06 NOTE — Telephone Encounter (Signed)
Ok to schedule for tdap

## 2017-11-13 ENCOUNTER — Other Ambulatory Visit: Payer: Self-pay | Admitting: Family Medicine

## 2017-11-16 NOTE — Telephone Encounter (Signed)
Last OV 10/07/17 last refill 10/07/17 for 90 no refills ok to fill?

## 2017-11-17 NOTE — Telephone Encounter (Signed)
Patient did not return call.

## 2017-11-17 NOTE — Telephone Encounter (Signed)
Sent to pharmacy.  Drug database reviewed. 

## 2017-12-14 ENCOUNTER — Other Ambulatory Visit: Payer: Self-pay | Admitting: Family Medicine

## 2017-12-25 ENCOUNTER — Other Ambulatory Visit: Payer: Self-pay | Admitting: Family Medicine

## 2018-01-05 ENCOUNTER — Telehealth: Payer: Self-pay | Admitting: Sports Medicine

## 2018-01-05 NOTE — Telephone Encounter (Signed)
Copied from CRM [id number]. Topic: Quick Communication - See Telephone Encounter >> Jan 05, 2018 12:38 PM Terisa Starr wrote: CRM for notification. See Telephone encounter for: 01/05/18.  Patient is calling to see if Dr Berline Chough could call her in something for her bilateral leg pain. She is coming in to see him on 9/19 at 3:20 pm. She has been taking ibuprofen but doesn't get much relief.   [facility] [place], [place] - [address] [place] [address]

## 2018-01-06 ENCOUNTER — Other Ambulatory Visit: Payer: Self-pay | Admitting: Physical Therapy

## 2018-01-06 MED ORDER — TRAMADOL HCL 50 MG PO TABS: 50.0000 mg | ORAL_TABLET | Freq: Three times a day (TID) | ORAL | 0 refills | Status: DC | PRN

## 2018-01-06 NOTE — Telephone Encounter (Signed)
Okay to send in tramadol 50mg  po q8 hours #15 no refills

## 2018-01-06 NOTE — Telephone Encounter (Signed)
Placed rx order for Tramadol per Dr. Janeece Riggers orders and called pt and LM to inform her.

## 2018-01-13 ENCOUNTER — Ambulatory Visit: Payer: Self-pay | Admitting: Sports Medicine

## 2018-01-14 ENCOUNTER — Ambulatory Visit: Payer: Self-pay | Admitting: Sports Medicine

## 2018-01-19 ENCOUNTER — Ambulatory Visit: Payer: Self-pay | Admitting: Sports Medicine

## 2018-01-19 ENCOUNTER — Encounter: Payer: Self-pay | Admitting: Sports Medicine

## 2018-01-19 VITALS — BP 174/110 | HR 101 | Ht 69.0 in | Wt 225.0 lb

## 2018-01-19 DIAGNOSIS — M25461 Effusion, right knee: Secondary | ICD-10-CM

## 2018-01-19 DIAGNOSIS — R768 Other specified abnormal immunological findings in serum: Secondary | ICD-10-CM

## 2018-01-19 DIAGNOSIS — M79605 Pain in left leg: Secondary | ICD-10-CM

## 2018-01-19 DIAGNOSIS — I10 Essential (primary) hypertension: Secondary | ICD-10-CM

## 2018-01-19 DIAGNOSIS — M25561 Pain in right knee: Secondary | ICD-10-CM

## 2018-01-19 DIAGNOSIS — R635 Abnormal weight gain: Secondary | ICD-10-CM

## 2018-01-19 DIAGNOSIS — M25462 Effusion, left knee: Secondary | ICD-10-CM

## 2018-01-19 DIAGNOSIS — M79604 Pain in right leg: Secondary | ICD-10-CM

## 2018-01-19 DIAGNOSIS — M25562 Pain in left knee: Secondary | ICD-10-CM

## 2018-01-19 MED ORDER — OLMESARTAN MEDOXOMIL 20 MG PO TABS: 20.0000 mg | ORAL_TABLET | Freq: Every day | ORAL | 1 refills | Status: DC

## 2018-01-19 MED ORDER — DICLOFENAC SODIUM 75 MG PO TBEC: 75.0000 mg | DELAYED_RELEASE_TABLET | Freq: Two times a day (BID) | ORAL | 0 refills | Status: DC

## 2018-01-19 MED ORDER — FAMOTIDINE 20 MG PO TABS: 20.0000 mg | ORAL_TABLET | Freq: Two times a day (BID) | ORAL | 1 refills | Status: DC

## 2018-01-19 MED ORDER — ATENOLOL 50 MG PO TABS: 50.0000 mg | ORAL_TABLET | Freq: Every day | ORAL | 1 refills | Status: AC

## 2018-01-19 NOTE — Progress Notes (Signed)
Elizabeth Dunn. [facility] at [facility] [phone]  CHANAE GEMMA - 57 y.o. female MRN [id number]  Date of birth: 11-01-1960  Visit Date: 01/19/2018  PCP: Glori Luis, MD   Referred by: Glori Luis, MD  Scribe for today's visit: Stevenson Clinch, CMA     SUBJECTIVE:  Denver Harder Spies is here for Follow-up (Bilateral leg pain) .  Referred by: Dr. Marikay Alar   HPI  09/15/2017: Her bilateral leg pain symptoms INITIALLY: Began 05/2017, no known injury or trauma.  Described as moderate-severe aching, radiating up and down both legs. At time she has a stabbing pain in her legs.  Worsened with standing, flexion She gets some relief when sleeping with a pillow between her knees.  Additional associated symptoms include: She has elevated rheumatoid factor with PCP. Pain is mostly in calves and hamstrings. She has noticed that she is bruising very easily. She c/o tenderness to palpation on the medial aspect of both knees. She has noticed popping in both knees. She noticed occasional swelling in her legs.    At this time symptoms are improving compared to onset but continue to be moderate and at times severe.  She has tried taking Naproxen with minimal relief. She gets some relief with IBU but it causes her BP to go up. She gets minimal relief with Tylenol. She was taking Gabapentin and Requip but she felt that they were causing weight gain and increasing her anxiety so she d/c these medications. She was taking Etodolac in the past but PCP did not refill this for her so she hasn't been taking it.   01/19/2018: Compared to the last office visit, her previously described symptoms are improving. She still has pain in both legs. She has started noticing tingling in her feet x 3 weeks. Tingling in her feet affects her at night. She has noticed mild swelling around the ankles and knees. Pain seems to be worse in the hamstrings today. She also  c/o increased knee pain when going from sit-to-stand.  Current symptoms are moderate & are radiating to throughout both legs. She wakes up at night sometimes in tears, this is occurring less often now.  She been taking 3-4 OTC IBU with some relief. She tried taking tramadol and got no relief. She received knee injection in the past with some relief.   ROS: Reports night time disturbances. Denies fevers, chills, or night sweats. Denies unexplained weight loss. Reports personal history of cancer, skin cancer. Denies changes in bowel or bladder habits. Denies recent unreported falls. Reports new or worsening dyspnea or wheezing. Denies headaches or dizziness.  Reports numbness, tingling or weakness  In the extremities.  Denies dizziness or presyncopal episodes Reports lower extremity edema    HISTORY:  Prior history reviewed and updated per electronic medical record.  Social History   Occupational History  . Not on file  Tobacco Use  . Smoking status: Never Smoker  . Smokeless tobacco: Never Used  Substance and Sexual Activity  . Alcohol use: Yes    Comment: 1 glass of wine  . Drug use: No  . Sexual activity: Not on file   Social History   Social History Narrative   Lives in [place]. 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     DATA OBTAINED & REVIEWED:  No results for input(s): HGBA1C, LABURIC, CREATINE in the last 8760 hours. .   OBJECTIVE:  VS:  HT:5\' 9"  (175.3 cm)   WT:225 lb (102.1 kg)  BMI:33.21    BP:(!) 174/110  HR:(!) 101bpm  TEMP: ( )  RESP:96 %   PHYSICAL EXAM: CONSTITUTIONAL: Well-developed, Well-nourished and In no acute distress PSYCHIATRIC: Alert & appropriately interactive. and Not depressed or anxious appearing. RESPIRATORY: No increased work of breathing and Trachea Midline EYES: Pupils are equal., EOM intact without nystagmus. and No scleral icterus.  VASCULAR EXAM: Warm and well perfused NEURO:  unremarkable  MSK Exam: Bilateral knees:  Well aligned, no significant deformity. No overlying skin changes. TTP over Medial and lateral joint lines bilaterally but nonfocal and no appreciable clicking popping or crepitation   RANGE OF MOTION & STRENGTH  Normal flexion and extension of the knees.   SPECIALITY TESTING:  Negative McMurray's and Thessaly  Slight genu valgus with ambulate     ASSESSMENT   1. Essential hypertension   2. Bilateral leg pain   3. Elevated rheumatoid factor   4. Acute pain of both knees   5. Effusion of both knee joints   6. Weight gain     PLAN:  Pertinent additional documentation may be included in corresponding procedure notes, imaging studies, problem based documentation and patient instructions.  Procedures:  . None  Medications:  Meds ordered this encounter  Medications  . atenolol (TENORMIN) 50 MG tablet    Sig: Take 1 tablet (50 mg total) by mouth daily.    Dispense:  30 tablet    Refill:  1  . olmesartan (BENICAR) 20 MG tablet    Sig: Take 1 tablet (20 mg total) by mouth daily.    Dispense:  30 tablet    Refill:  1  . diclofenac (VOLTAREN) 75 MG EC tablet    Sig: Take 1 tablet (75 mg total) by mouth 2 (two) times daily. Take 1 tab bid X 14 days then as needed    Dispense:  30 tablet    Refill:  0  . famotidine (PEPCID) 20 MG tablet    Sig: Take 1 tablet (20 mg total) by mouth 2 (two) times daily. Take with NSAID    Dispense:  60 tablet    Refill:  1   Discussion/Instructions: No problem-specific Assessment & Plan notes found for this encounter.  Marland Kitchen. Ultimately she has poorly controlled hypertension and possibly an underlying autoimmune condition.  She does not have insurance which limits of the ability to further test for conditions.  She has responded well to Voltaren in the past and I would like to have her continue with this intermittently talk to the appropriate bursting.  Med refills provided today she should follow-up with  her PCP for further titration of these medicines . Discussed red flag symptoms that warrant earlier emergent evaluation and patient voices understanding. . Activity modifications and the importance of avoiding exacerbating activities (limiting pain to no more than a 4 / 10 during or following activity) recommended and discussed. . >50% of this 25 minutes minute visit spent in direct patient counseling and/or coordination of care. Discussion was focused on education regarding the in discussing the pathoetiology and anticipated clinical course of the above condition.  Follow-up:  . Return if symptoms worsen or fail to improve.   . If any lack of improvement consider: further diagnostic evaluation with Further testing with anti-CCP antibody and/or referral to rheumatology.      CMA/ATC served as Neurosurgeonscribe during this visit. History, Physical, and Plan performed by medical provider. Documentation and orders reviewed and attested  to.      Andrena Mews, DO    [facility]

## 2018-01-22 ENCOUNTER — Telehealth: Payer: Self-pay

## 2018-01-22 ENCOUNTER — Ambulatory Visit: Payer: Self-pay | Admitting: Family Medicine

## 2018-01-22 NOTE — Telephone Encounter (Signed)
Dr. Birdie Sons when would you like to fit her into your schedule?

## 2018-01-22 NOTE — Telephone Encounter (Signed)
Copied from CRM [id number]. Topic: Quick Communication - Appointment Cancellation >> Jan 22, 2018 10:29 AM Alexander Bergeron B wrote: Reason for CRM: pt's daughter has fallen and has to take her to the chiropractor today; she is trying to reschedule but due to pcp availability pt would be booked too far out; contact pt to help get in sooner if possible

## 2018-01-25 NOTE — Telephone Encounter (Signed)
It is ok to schedule for 4:30 on a Monday, Wednesday, or Friday. Thanks.

## 2018-01-26 NOTE — Telephone Encounter (Signed)
Called pt and left a VM to call back. CRM created and sent to PEC pool. 

## 2018-01-28 NOTE — Telephone Encounter (Signed)
Called pt and left a VM to call back to get scheduled for an appt.

## 2018-01-29 ENCOUNTER — Ambulatory Visit: Payer: Self-pay | Admitting: Family Medicine

## 2018-02-07 ENCOUNTER — Encounter: Payer: Self-pay | Admitting: Sports Medicine

## 2018-02-08 ENCOUNTER — Ambulatory Visit (INDEPENDENT_AMBULATORY_CARE_PROVIDER_SITE_OTHER): Payer: Self-pay | Admitting: Family Medicine

## 2018-02-08 ENCOUNTER — Encounter: Payer: Self-pay | Admitting: Family Medicine

## 2018-02-08 VITALS — BP 170/100 | HR 97 | Temp 98.4°F | Ht <= 58 in | Wt 227.8 lb

## 2018-02-08 DIAGNOSIS — R221 Localized swelling, mass and lump, neck: Secondary | ICD-10-CM

## 2018-02-08 DIAGNOSIS — F419 Anxiety disorder, unspecified: Secondary | ICD-10-CM

## 2018-02-08 DIAGNOSIS — I10 Essential (primary) hypertension: Secondary | ICD-10-CM

## 2018-02-08 NOTE — Patient Instructions (Signed)
Nice to see you. We will check lab work today and contact her with the results. If you develop chest pain, shortness of breath, numbness, weakness, worsening headaches, or any new or change in symptoms please seek medical attention immediately.

## 2018-02-08 NOTE — Progress Notes (Signed)
[name], MD Phone: [phone]  Elizabeth Dunn is a 57 y.o. female who presents today for f/u.  CC: htn, anxiety, supraclavicular swelling  HYPERTENSION  Disease Monitoring  Home BP Monitoring 210/120 this am Chest pain- no    Dyspnea- no Medications  Compliance-  Taking benicar. Not taking atenolol consistently. Lightheadedness-  rare  Edema- no Does not occasional headaches.  Anxiety: She does note some anxiety.  She denies depression though does note she does not really want to do anything in the morning when she has to get up out of bed.  Not a lot of energy.  She takes Prozac and Klonopin.  No drowsiness with the Klonopin.  No alcohol use.  No SI.  Supraclavicular swelling: Patient has had intermittent left supraclavicular and left side of neck and face swelling for over a year now.  Previously seen by her former PCP for this and she underwent CT scan of her neck with no obvious cause.  She notes occasional tingling in her lower face only when the area is swollen to its maximal amount.  That occurs about once a week.  That has been going on the entire time she has had the swelling.  She reports chronic left arm swelling as well which she reports was worked up in the 1990s and she was told it was lymphedema.  She has no numbness or weakness.  She has no pain.  She has not had any tingling in the last week.    Social History   Tobacco Use  Smoking Status Never Smoker  Smokeless Tobacco Never Used     ROS see history of present illness  Objective  Physical Exam Vitals:   02/08/18 1622  BP: (!) 170/100  Pulse: 97  Temp: 98.4 F (36.9 C)  SpO2: 96%   BP left arm 180/110 BP right arm 170/100  BP Readings from Last 3 Encounters:  02/08/18 (!) 170/100  01/19/18 (!) 174/110  10/07/17 (!) 178/94   Wt Readings from Last 3 Encounters:  02/08/18 227 lb 12.8 oz (103.3 kg)  01/19/18 225 lb (102.1 kg)  10/07/17 217 lb 9.6 oz (98.7 kg)    Physical Exam    Constitutional: No distress.  HENT:  Head: Normocephalic and atraumatic.  Neck: Neck supple.  There is apparent soft tissue swelling in her left supraclavicular area and left neck as well as over her left jawline, there are no palpable defects in these areas and no underlying mass lesions noted  Cardiovascular: Normal rate, regular rhythm and normal heart sounds.  Pulmonary/Chest: Effort normal and breath sounds normal.  Musculoskeletal: She exhibits no edema.  Lymphadenopathy:    She has no cervical adenopathy.       Right: No supraclavicular adenopathy present.       Left: No supraclavicular adenopathy present.  Neurological: She is alert.  Skin: Skin is warm and dry. She is not diaphoretic.     Assessment/Plan: Please see individual problem list.  Anxiety Relatively stable now.  She will continue with her current treatment regimen while we sort out her blood pressure issues.  Hypertension Remains uncontrolled.  She has not been able to tolerate numerous medications.  We will check a BMP and if renal function and potassium allow we can increase the Benicar.  Could consider adding spironolactone if needed in the future.  I also discussed possibly undertaking evaluation for causes of secondary hypertension given how elevated her blood pressure has been.  She does not have insurance and  is unsure if she would be able to afford this work-up.  We will give her financial assistance paperwork to complete and if she qualifies proceed with the work-up.  Localized swelling, mass and lump, neck Prior CT scan with no cause.  Has been occurring intermittently over the last year.  Tingling possibly related to her swelling.  Discussed additional work-up for this would include possible CT chest to evaluate her chest for cause versus referral.  Patient has financial issues and is going to apply for financial assistance prior to proceeding with any further evaluation.  I discussed return precautions with  her.   Orders Placed This Encounter  Procedures  . Comp Met (CMET)  . CBC  . TSH    No orders of the defined types were placed in this encounter.    [name], MD [place]

## 2018-02-09 ENCOUNTER — Encounter: Payer: Self-pay | Admitting: Family Medicine

## 2018-02-09 LAB — CBC
HCT: 42.4 % (ref 36.0–46.0)
Hemoglobin: 14.4 g/dL (ref 12.0–15.0)
MCHC: 33.9 g/dL (ref 30.0–36.0)
MCV: 95.2 fl (ref 78.0–100.0)
Platelets: 323 10*3/uL (ref 150.0–400.0)
RBC: 4.45 Mil/uL (ref 3.87–5.11)
RDW: 12.7 % (ref 11.5–15.5)
WBC: 8.6 10*3/uL (ref 4.0–10.5)

## 2018-02-09 LAB — COMPREHENSIVE METABOLIC PANEL
ALBUMIN: 4.1 g/dL (ref 3.5–5.2)
ALK PHOS: 77 U/L (ref 39–117)
ALT: 56 U/L — AB (ref 0–35)
AST: 38 U/L — ABNORMAL HIGH (ref 0–37)
BILIRUBIN TOTAL: 0.3 mg/dL (ref 0.2–1.2)
BUN: 20 mg/dL (ref 6–23)
CALCIUM: 9.4 mg/dL (ref 8.4–10.5)
CO2: 29 mEq/L (ref 19–32)
Chloride: 104 mEq/L (ref 96–112)
Creatinine, Ser: 0.84 mg/dL (ref 0.40–1.20)
GFR: 74.22 mL/min (ref >60.00)
GLUCOSE: 102 mg/dL — AB (ref 70–99)
Potassium: 4.3 mEq/L (ref 3.5–5.1)
Sodium: 139 mEq/L (ref 135–145)
Total Protein: 7 g/dL (ref 6.0–8.3)

## 2018-02-09 LAB — TSH: TSH: 4.51 u[IU]/mL — ABNORMAL HIGH (ref 0.35–4.50)

## 2018-02-09 NOTE — Assessment & Plan Note (Signed)
Remains uncontrolled.  She has not been able to tolerate numerous medications.  We will check a BMP and if renal function and potassium allow we can increase the Benicar.  Could consider adding spironolactone if needed in the future.  I also discussed possibly undertaking evaluation for causes of secondary hypertension given how elevated her blood pressure has been.  She does not have insurance and is unsure if she would be able to afford this work-up.  We will give her financial assistance paperwork to complete and if she qualifies proceed with the work-up.

## 2018-02-09 NOTE — Assessment & Plan Note (Signed)
Prior CT scan with no cause.  Has been occurring intermittently over the last year.  Tingling possibly related to her swelling.  Discussed additional work-up for this would include possible CT chest to evaluate her chest for cause versus referral.  Patient has financial issues and is going to apply for financial assistance prior to proceeding with any further evaluation.  I discussed return precautions with her.

## 2018-02-09 NOTE — Assessment & Plan Note (Signed)
Relatively stable now.  She will continue with her current treatment regimen while we sort out her blood pressure issues.

## 2018-02-10 ENCOUNTER — Other Ambulatory Visit: Payer: Self-pay | Admitting: Family Medicine

## 2018-02-10 NOTE — Telephone Encounter (Signed)
Copied from CRM [id number]. Topic: Quick Communication - Rx Refill/Question >> Feb 10, 2018  1:14 PM Lorrine Kin, [place] wrote: **Patient calling and states that Dr Birdie Sons never sent the refills for these medications after her OV. Patient also states that she received a phone call about her Benicar dosage. Was not sure if that needed to be changed since nothing has been sent to her pharmacy regarding that. Please advise.**  Medication: clonazePAM (KLONOPIN) 1 MG tablet  FLUoxetine (PROZAC) 20 MG capsule  Has the patient contacted their pharmacy? Yes.   (Agent: If no, request that the patient contact the pharmacy for the refill.) (Agent: If yes, when and what did the pharmacy advise?)  Preferred Pharmacy (with phone number or street name): [facility] - [place], [place] - [address]  Agent: Please be advised that RX refills may take up to 3 business days. We ask that you follow-up with your pharmacy.

## 2018-02-10 NOTE — Telephone Encounter (Signed)
Last OV 02/08/2018   Benicar needs to be refilled with new increased dose   Clonazepam last refilled 11/17/2017 disp 90 with 1 refill   prozac last refilled 08/08/2017 disp 90 with 1 refill

## 2018-02-11 ENCOUNTER — Encounter: Payer: Self-pay | Admitting: Family Medicine

## 2018-02-11 ENCOUNTER — Telehealth: Payer: Self-pay | Admitting: Family Medicine

## 2018-02-11 ENCOUNTER — Other Ambulatory Visit: Payer: Self-pay | Admitting: Family Medicine

## 2018-02-11 DIAGNOSIS — R7989 Other specified abnormal findings of blood chemistry: Secondary | ICD-10-CM

## 2018-02-11 DIAGNOSIS — R945 Abnormal results of liver function studies: Secondary | ICD-10-CM

## 2018-02-11 DIAGNOSIS — I10 Essential (primary) hypertension: Secondary | ICD-10-CM

## 2018-02-11 MED ORDER — FLUOXETINE HCL 20 MG PO CAPS: ORAL_CAPSULE | ORAL | 1 refills | Status: AC

## 2018-02-11 MED ORDER — OLMESARTAN MEDOXOMIL 40 MG PO TABS: 40.0000 mg | ORAL_TABLET | Freq: Every day | ORAL | 1 refills | Status: AC

## 2018-02-11 NOTE — Telephone Encounter (Deleted)
Copied from CRM #[id number]. Topic: Quick Communication - Rx Refill/Question °>> Feb 11, 2018  9:48 AM Kennedy, Cheryl W wrote: °Medication    clonazePAM (KLONOPIN) 1 MG tablet     pharmacy told pt she did not have any more refills  ° °Has the patient contacted their pharmacy  yes   ° °Preferred Pharmacy     on file  ° °Agent: Please be advised that RX refills may take up to 3 business days. We ask that you follow-up with your pharmacy. °

## 2018-02-11 NOTE — Telephone Encounter (Signed)
Refill sent to pharmacy.  New dose of Benicar sent to pharmacy.  She needs lab work done in 7 to 10 days.  Please contact her to get that set up.  Orders have been placed.

## 2018-02-11 NOTE — Telephone Encounter (Signed)
Copied from CRM [id number]. Topic: Quick Communication - Rx Refill/Question >> Feb 11, 2018  9:48 AM Percival Spanish wrote: Medication    clonazePAM (KLONOPIN) 1 MG tablet     pharmacy told pt she did not have any more refills   Has the patient contacted their pharmacy  yes    Preferred Pharmacy     on file   Agent: Please be advised that RX refills may take up to 3 business days. We ask that you follow-up with your pharmacy.

## 2018-02-12 NOTE — Telephone Encounter (Signed)
Ok to send orders. 

## 2018-02-15 NOTE — Telephone Encounter (Signed)
Left message for patient to call back. OK for PEC to advise

## 2018-02-15 NOTE — Telephone Encounter (Signed)
Please call the patient and see if she has continued to have the swelling in her neck. If she has she should be evaluated today. If she has had trouble breathing or swallowing with it she should go to the ED. If she has not had those symptoms she needs to go to urgent care for evaluation. Thanks.

## 2018-02-17 ENCOUNTER — Encounter: Payer: Self-pay | Admitting: *Deleted

## 2018-02-18 ENCOUNTER — Ambulatory Visit: Payer: Self-pay

## 2018-02-22 ENCOUNTER — Ambulatory Visit: Payer: Self-pay

## 2018-02-22 ENCOUNTER — Telehealth: Payer: Self-pay | Admitting: Family Medicine

## 2018-02-22 NOTE — Telephone Encounter (Signed)
FYI, I called pt to schedule her US Thyroid order and she stated that she was not feeling well and went to [facility] and her Thyroid was checked there and she was put on Thyroid medication.

## 2018-02-23 NOTE — Telephone Encounter (Signed)
Pt stated that she is not sure at this time what she plans to do will need time to think about it and will get back to [place]. Pt stated that she just currently has a lot going on and is heading to the beach but will get back to [place] on her choice.

## 2018-02-23 NOTE — Telephone Encounter (Signed)
Sent to PCP as an FYI  

## 2018-02-23 NOTE — Telephone Encounter (Signed)
Noted. Please see my result note. Please check with the patient to see if she if going to continue to follow with [place] or if she is going to see [facility] (whom she saw recently) for her primary care. She will need to choose one.

## 2018-03-04 ENCOUNTER — Other Ambulatory Visit: Payer: Self-pay | Admitting: Sports Medicine

## 2018-03-04 NOTE — Telephone Encounter (Signed)
Per OV note 01/19/2018 -  She has responded well to Voltaren in the past and I would like to have her continue with this intermittently talk to the appropriate bursting.  Med refills provided today she should follow-up with her PCP for further titration of these medicines

## 2018-03-05 MED ORDER — DICLOFENAC SODIUM 75 MG PO TBEC: 75.0000 mg | DELAYED_RELEASE_TABLET | Freq: Two times a day (BID) | ORAL | 0 refills | Status: AC

## 2018-03-05 NOTE — Telephone Encounter (Signed)
Patient calling to check status of refill. Patient advised on refill request policy. States that she has to go pick up some other prescriptions this afternoon and wanted this to be added if possible. States she has 4 pills left.

## 2018-03-05 NOTE — Telephone Encounter (Signed)
Dr. Berline Chough, see note below from last OV and advise if OK to refill

## 2018-03-09 ENCOUNTER — Ambulatory Visit: Payer: Self-pay | Admitting: Family Medicine

## 2018-03-09 ENCOUNTER — Encounter: Payer: Self-pay | Admitting: Sports Medicine

## 2018-03-09 ENCOUNTER — Encounter: Payer: Self-pay | Admitting: Family Medicine

## 2018-03-09 ENCOUNTER — Encounter: Payer: Self-pay | Admitting: Internal Medicine
